# Patient Record
Sex: Female | Born: 1965 | Race: Black or African American | Hispanic: No | Marital: Married | Smoking: Current every day smoker
Health system: Southern US, Community
[De-identification: ages and names within clinical notes are randomized; demographics above are authoritative.]

## PROBLEM LIST (undated history)

## (undated) DIAGNOSIS — E785 Hyperlipidemia, unspecified: Secondary | ICD-10-CM

## (undated) DIAGNOSIS — Z789 Other specified health status: Secondary | ICD-10-CM

## (undated) HISTORY — PX: HAMMER TOE SURGERY: SHX385

## (undated) HISTORY — PX: BREAST SURGERY: SHX581

---

## 1998-05-14 ENCOUNTER — Other Ambulatory Visit: Admission: RE | Admit: 1998-05-14 | Discharge: 1998-05-14 | Payer: Self-pay | Admitting: Obstetrics and Gynecology

## 1998-12-31 ENCOUNTER — Ambulatory Visit (HOSPITAL_COMMUNITY): Admission: RE | Admit: 1998-12-31 | Discharge: 1998-12-31 | Payer: Self-pay | Admitting: Family Medicine

## 1998-12-31 ENCOUNTER — Encounter: Payer: Self-pay | Admitting: Family Medicine

## 1999-05-18 ENCOUNTER — Other Ambulatory Visit: Admission: RE | Admit: 1999-05-18 | Discharge: 1999-05-18 | Payer: Self-pay | Admitting: *Deleted

## 1999-06-07 ENCOUNTER — Ambulatory Visit (HOSPITAL_COMMUNITY): Admission: RE | Admit: 1999-06-07 | Discharge: 1999-06-07 | Payer: Self-pay | Admitting: Obstetrics

## 1999-06-07 ENCOUNTER — Encounter: Payer: Self-pay | Admitting: Obstetrics

## 1999-08-30 ENCOUNTER — Inpatient Hospital Stay (HOSPITAL_COMMUNITY): Admission: AD | Admit: 1999-08-30 | Discharge: 1999-08-30 | Payer: Self-pay | Admitting: Obstetrics

## 1999-10-16 ENCOUNTER — Inpatient Hospital Stay (HOSPITAL_COMMUNITY): Admission: AD | Admit: 1999-10-16 | Discharge: 1999-10-16 | Payer: Self-pay | Admitting: Obstetrics

## 1999-11-17 ENCOUNTER — Inpatient Hospital Stay (HOSPITAL_COMMUNITY): Admission: AD | Admit: 1999-11-17 | Discharge: 1999-11-17 | Payer: Self-pay | Admitting: Obstetrics

## 1999-11-29 ENCOUNTER — Inpatient Hospital Stay (HOSPITAL_COMMUNITY): Admission: AD | Admit: 1999-11-29 | Discharge: 1999-11-29 | Payer: Self-pay | Admitting: Obstetrics

## 1999-12-31 ENCOUNTER — Inpatient Hospital Stay (HOSPITAL_COMMUNITY): Admission: AD | Admit: 1999-12-31 | Discharge: 2000-01-03 | Payer: Self-pay | Admitting: Obstetrics

## 1999-12-31 ENCOUNTER — Encounter (INDEPENDENT_AMBULATORY_CARE_PROVIDER_SITE_OTHER): Payer: Self-pay

## 2000-06-12 ENCOUNTER — Encounter (INDEPENDENT_AMBULATORY_CARE_PROVIDER_SITE_OTHER): Payer: Self-pay | Admitting: *Deleted

## 2000-06-12 ENCOUNTER — Ambulatory Visit (HOSPITAL_BASED_OUTPATIENT_CLINIC_OR_DEPARTMENT_OTHER): Admission: RE | Admit: 2000-06-12 | Discharge: 2000-06-13 | Payer: Self-pay | Admitting: Specialist

## 2001-04-10 ENCOUNTER — Other Ambulatory Visit: Admission: RE | Admit: 2001-04-10 | Discharge: 2001-04-10 | Payer: Self-pay | Admitting: Obstetrics and Gynecology

## 2001-08-06 ENCOUNTER — Emergency Department (HOSPITAL_COMMUNITY): Admission: EM | Admit: 2001-08-06 | Discharge: 2001-08-06 | Payer: Self-pay | Admitting: *Deleted

## 2001-09-10 ENCOUNTER — Encounter: Admission: RE | Admit: 2001-09-10 | Discharge: 2001-09-10 | Payer: Self-pay | Admitting: Urology

## 2001-09-10 ENCOUNTER — Encounter: Payer: Self-pay | Admitting: Urology

## 2002-06-03 ENCOUNTER — Other Ambulatory Visit: Admission: RE | Admit: 2002-06-03 | Discharge: 2002-06-03 | Payer: Self-pay | Admitting: Family Medicine

## 2004-04-06 ENCOUNTER — Emergency Department (HOSPITAL_COMMUNITY): Admission: EM | Admit: 2004-04-06 | Discharge: 2004-04-06 | Payer: Self-pay | Admitting: Family Medicine

## 2004-04-07 ENCOUNTER — Ambulatory Visit (HOSPITAL_COMMUNITY): Admission: RE | Admit: 2004-04-07 | Discharge: 2004-04-07 | Payer: Self-pay | Admitting: Family Medicine

## 2008-12-18 ENCOUNTER — Encounter: Payer: Self-pay | Admitting: Internal Medicine

## 2008-12-18 LAB — CONVERTED CEMR LAB
ALT: 17 units/L
Albumin: 4.4 g/dL
BUN: 12 mg/dL
Basophils Relative: 0 %
CO2: 26 meq/L
Chloride: 101 meq/L
Hemoglobin: 13.7 g/dL
LDL Cholesterol: 173 mg/dL
Lymphocytes, automated: 46 %
Monocytes Relative: 9 %
Platelets: 309 10*3/uL
Potassium: 3.9 meq/L
Total Protein: 7.8 g/dL
WBC: 4.8 10*3/uL

## 2009-02-04 ENCOUNTER — Encounter: Admission: RE | Admit: 2009-02-04 | Discharge: 2009-02-04 | Payer: Self-pay | Admitting: Family Medicine

## 2009-02-22 ENCOUNTER — Emergency Department (HOSPITAL_COMMUNITY): Admission: EM | Admit: 2009-02-22 | Discharge: 2009-02-22 | Payer: Self-pay | Admitting: Emergency Medicine

## 2009-03-09 ENCOUNTER — Emergency Department (HOSPITAL_COMMUNITY): Admission: EM | Admit: 2009-03-09 | Discharge: 2009-03-09 | Payer: Self-pay | Admitting: Family Medicine

## 2009-03-09 DIAGNOSIS — H9209 Otalgia, unspecified ear: Secondary | ICD-10-CM | POA: Insufficient documentation

## 2009-03-09 DIAGNOSIS — M25529 Pain in unspecified elbow: Secondary | ICD-10-CM | POA: Insufficient documentation

## 2009-03-26 ENCOUNTER — Encounter: Payer: Self-pay | Admitting: Internal Medicine

## 2009-03-26 LAB — CONVERTED CEMR LAB
AST: 20 units/L
CO2: 23 meq/L
Chloride: 102 meq/L
Cholesterol: 236 mg/dL
HDL: 50 mg/dL
Total Bilirubin: 0.3 mg/dL
Triglyceride fasting, serum: 148 mg/dL

## 2009-06-19 ENCOUNTER — Encounter (INDEPENDENT_AMBULATORY_CARE_PROVIDER_SITE_OTHER): Payer: Self-pay | Admitting: *Deleted

## 2009-06-22 ENCOUNTER — Encounter: Admission: RE | Admit: 2009-06-22 | Discharge: 2009-06-22 | Payer: Self-pay | Admitting: Family Medicine

## 2009-06-24 ENCOUNTER — Encounter (INDEPENDENT_AMBULATORY_CARE_PROVIDER_SITE_OTHER): Payer: Self-pay | Admitting: *Deleted

## 2009-06-24 LAB — CONVERTED CEMR LAB
Albumin: 4.1 g/dL
CO2: 23 meq/L
Chloride: 101 meq/L
Cholesterol: 235 mg/dL
Creatinine, Ser: 0.67 mg/dL
HDL: 55 mg/dL
MCV: 93 fL
Neutrophils Relative %: 40 %
Platelets: 286 10*3/uL
Potassium: 4.1 meq/L
RBC: 4.38 M/uL
RDW: 14 %
Sodium: 136 meq/L
Triglyceride fasting, serum: 138 mg/dL

## 2010-05-26 ENCOUNTER — Encounter (INDEPENDENT_AMBULATORY_CARE_PROVIDER_SITE_OTHER): Payer: Self-pay | Admitting: *Deleted

## 2010-05-26 LAB — CONVERTED CEMR LAB
AST: 20 units/L
Calcium: 9.3 mg/dL
Cholesterol: 195 mg/dL
Glucose, Bld: 103 mg/dL
LDL Cholesterol: 128 mg/dL
Potassium: 3.8 meq/L
Sodium: 139 meq/L
TSH: 2.71 microintl units/mL
Total Protein: 7.3 g/dL

## 2010-08-27 ENCOUNTER — Emergency Department (HOSPITAL_COMMUNITY): Admission: EM | Admit: 2010-08-27 | Discharge: 2010-08-27 | Payer: Self-pay | Admitting: Emergency Medicine

## 2011-01-05 ENCOUNTER — Encounter (INDEPENDENT_AMBULATORY_CARE_PROVIDER_SITE_OTHER): Payer: Self-pay | Admitting: *Deleted

## 2011-01-05 DIAGNOSIS — E785 Hyperlipidemia, unspecified: Secondary | ICD-10-CM | POA: Insufficient documentation

## 2011-01-07 ENCOUNTER — Ambulatory Visit: Payer: Self-pay | Admitting: Internal Medicine

## 2011-01-07 ENCOUNTER — Encounter: Payer: Self-pay | Admitting: Internal Medicine

## 2011-01-24 ENCOUNTER — Ambulatory Visit: Payer: Self-pay | Admitting: Internal Medicine

## 2011-04-15 NOTE — Op Note (Signed)
[place]  Patient:    [name], [name]                       MRN: [id number] Proc. Date: 06/12/00 Adm. Date:  [id number] Attending:  Gustavus Messing CC:         Yaakov Guthrie. Shon Hough, M.D. (2 copies)                           Operative Report  INDICATIONS:  This is a 45 year old lady with severe macromastia, increased back and shoulder pain, also a resistance to conservative treatment including analgesics.  OPERATION PLANNED:  Bilateral breast reductions using the inferior pedicle technique.  SURGEON:  Yaakov Guthrie. Shon Hough, M.D.  ASSESSMENT:  Margaretha Sheffield, R.N.  DESCRIPTION OF PROCEDURE:  The patient was taken to the operating room and placed on the operating room table in the supine position and was given adequate general anesthesia after she had been setup and drawn for the new measurements about 20 cm from the suprasternal notch and the inferior pedicle reduction mammoplasty.  Preop was done to the chest and breast areas in the routine fashion after she was intubated, and walled off with sterile towels and draped so as to make a sterile field.  Xylocaine 0.25% with epinephrine at 1:400,000 concentration was injected locally 100 cc per thigh.  Next, the areas were scored with a #15 blade and then the skin over the inferior pedicle was de-epithelialized with a #20 blade.  Next, the medial and fatty dermal pedicles were excised down to underlying fascia, and also a new keyhole area was also debulked out laterally and more tissue was taken to improve symmetry.  After proper hemostasis the flaps were transposed and staid with 3-0 Prolene.  Subcutaneous closure was done with 3-0 Monocryl x 2 layers and a running subcuticular stitch of 3-0 Monocryl and 5-0 Monocryl throughout the inverted T.  The wounds were draining with #10 Blake drains which were placed in the depths of wound and brought out through the lateral most portion of the incision  and secured with 3-0 Prolene.  The wounds were cleansed, 0.5 inch Steri-Strips and soft dressings were applied and Xeroform, 4 x 4s, ABDs, Hypafix tape.  She was then taken to recovery in excellent condition.  Estimated blood loss less than 150 cc.  Complications, none.  At the end of the procedure the nipple, areola complexes show good blood supply. DD:  06/12/00 TD:  06/13/00 Job: [id number]

## 2011-04-15 NOTE — H&P (Signed)
[facility]  PatientTOMEKA KANTNER                        MRN: [id number] Adm. Date:  [id number] Attending:  Venita Sheffield                         History and Physical  HISTORY OF PRESENT ILLNESS:   Patient is a 45 year old gravida 2, para 1-0-0-1, who underwent a previous cesarean section.  Her EDC was January 06, 2000.  She was admitted with ruptured membranes which occurred on December 29, 1999; however, patient did not come to the hospital until today.  Cervix was 2 cm, 70%, vertex -3. Fluid color was unknown.  She had a reactive tracing.  Now, at 11:30, IUPC and scalp lead were applied.  She was noted to be having variable decelerations with each contraction and by this time, she had been on Pitocin.  Pitocin was discontinued and amnioinfusion was begun and the plan was to restart the Pitocin after the loading dose of the amnioinfusion; however, by 11:50, with each contraction, she was having prolonged variable decelerations, her cervix was unchanged and it was decided she would be delivered by repeat low transverse cesarean section because of non-reassuring fetal heart.  PHYSICAL EXAMINATION:  LUNGS:                        Clear.  HEART:                        Regular rhythm.  No murmurs.  No gallops.  ABDOMEN:                      Term-size uterus.  Fetal heart 140.  PELVIC:                       As described above.  EXTREMITIES:                  Negative. DD:  12/31/99 TD:  12/31/99 Job: [id number]

## 2011-04-15 NOTE — Discharge Summary (Signed)
[facility]  PatientADELAYDE MINNEY                        MRN: [id number] Adm. Date:  [id number] Disc. Date: 01/03/00 Attending:  Venita Sheffield                           Discharge Summary  HISTORY OF PRESENT ILLNESS:   The patient is a 45 year old gravida 2 para 1-0-0-1, previous C section, [facility] January 06, 2000.  She was admitted with ruptured membranes, which occurred on January 31.  Cervix 2 cm, 70%, vertex, -3.  There as a question of meconium-stained fluid.  Patient underwent a repeat low transverse cesarean section because of nonreassuring fetal heart rate tracing.  She had a 7 pound 7 ounce female, Apgars 8 and 9.  Postoperatively, she did well.  Her hemoglobin on admission was 13.2, discharge 9.4.  DISPOSITION:                  She was discharged home on postoperative day #3, ambulatory.  DIET:                         Regular.  DISCHARGE MEDICATIONS:        Tylenol No. 3 and ferrous sulfate.  FOLLOW-UP:                    To see me in six weeks.  DISCHARGE DIAGNOSES:          Status post repeat low transverse cesarean section at term for nonreassuring fetal heart rate tracing. DD:  01/03/00 TD:  01/03/00 Job: [id number]

## 2011-04-15 NOTE — Op Note (Signed)
[facility]  PatientMERIDEE Nash                        MRN: [id number] Proc. Date: 12/31/99 Adm. Date:  [id number] Attending:  Venita Sheffield                           Operative Report  PREOPERATIVE DIAGNOSES:       Non-reassuring fetal heart rate tracing; previous  cesarean section.  POSTOPERATIVE DIAGNOSES:      Non-reassuring fetal heart rate tracing; previous  cesarean section.  SURGEON:                      Kathreen Cosier, M.D.  ANESTHESIA:                   Epidural.  DESCRIPTION OF PROCEDURE:     Patient was placed on the operating table in a supine position, abdomen prepped and draped and bladder emptied with a Foley catheter. A transverse suprapubic incision was made through an old scar and carried down to the rectus fascia, the fascia cleaned and incised the length of the incision, rectus muscle retracted laterally and peritoneum incised longitudinally.  It was noted on entering the abdomen that there was a large amount of fluid behind the visceroperitoneum in the region of the old uterine scar.  The visceroperitoneum was incised and this fluid was noted to be clear.  By history, the patient had an infection and was hospitalized for two weeks after her previous cesarean section, so this appeared to be the remnants of old hematoma.  A transverse lower uterine incision was made and patient delivered from the OP position of a female, Apgar 8/9; the team was in attendance.  The baby weighed 7 pounds 7 ounces.  The placenta as posterior and removed manually.  Uterine cavity was cleaned with dry laps. Uterine incision was closed with interlocking suture of #1 chromic, including myometrium and endometrium.  The amniotic fluid was clear.  Hemostasis was satisfactory. he bladder flap was reattached with 2-0 chromic.  Uterus was well-contracted. Tubes and ovaries were normal.  Abdomen was closed in layers:  The  peritoneum with a continuous suture of 0 chromic, fascia with continuous suture of 0 Dexon and the skin closed with a subcuticular stitch of 3-0 plain.  Blood loss was 500 cc. Patient tolerated her procedure well and taken to recovery room in good condition. DD:  12/31/99 TD:  01/02/00 Job: [id number]

## 2011-04-20 ENCOUNTER — Other Ambulatory Visit (HOSPITAL_COMMUNITY)
Admission: RE | Admit: 2011-04-20 | Discharge: 2011-04-20 | Disposition: A | Discharge status: Home / Self Care | Payer: Commercial Indemnity | Source: Ambulatory Visit | Attending: Family Medicine | Admitting: Family Medicine

## 2011-04-20 DIAGNOSIS — Z1159 Encounter for screening for other viral diseases: Secondary | ICD-10-CM | POA: Insufficient documentation

## 2011-04-20 DIAGNOSIS — Z01419 Encounter for gynecological examination (general) (routine) without abnormal findings: Secondary | ICD-10-CM | POA: Insufficient documentation

## 2011-04-28 ENCOUNTER — Ambulatory Visit: Payer: Self-pay | Admitting: Internal Medicine

## 2011-08-15 ENCOUNTER — Emergency Department (HOSPITAL_COMMUNITY)
Admission: EM | Admit: 2011-08-15 | Discharge: 2011-08-15 | Disposition: A | Discharge status: Home / Self Care | Payer: Managed Care, Other (non HMO) | Attending: Emergency Medicine | Admitting: Emergency Medicine

## 2011-08-15 ENCOUNTER — Emergency Department (HOSPITAL_COMMUNITY): Payer: Managed Care, Other (non HMO)

## 2011-08-15 DIAGNOSIS — E785 Hyperlipidemia, unspecified: Secondary | ICD-10-CM | POA: Insufficient documentation

## 2011-08-15 DIAGNOSIS — R079 Chest pain, unspecified: Secondary | ICD-10-CM | POA: Insufficient documentation

## 2011-08-15 DIAGNOSIS — R05 Cough: Secondary | ICD-10-CM | POA: Insufficient documentation

## 2011-08-15 DIAGNOSIS — R059 Cough, unspecified: Secondary | ICD-10-CM | POA: Insufficient documentation

## 2011-08-15 DIAGNOSIS — J40 Bronchitis, not specified as acute or chronic: Secondary | ICD-10-CM | POA: Insufficient documentation

## 2011-08-15 DIAGNOSIS — R1310 Dysphagia, unspecified: Secondary | ICD-10-CM | POA: Insufficient documentation

## 2012-01-31 ENCOUNTER — Encounter (HOSPITAL_COMMUNITY): Payer: Self-pay | Admitting: Pharmacist

## 2012-02-02 ENCOUNTER — Encounter (HOSPITAL_COMMUNITY): Payer: Self-pay

## 2012-02-02 ENCOUNTER — Encounter (HOSPITAL_COMMUNITY)
Admission: RE | Admit: 2012-02-02 | Discharge: 2012-02-02 | Disposition: A | Discharge status: Home / Self Care | Payer: Commercial Managed Care - PPO | Source: Ambulatory Visit | Attending: Obstetrics and Gynecology | Admitting: Obstetrics and Gynecology

## 2012-02-02 HISTORY — DX: Hyperlipidemia, unspecified: E78.5

## 2012-02-02 HISTORY — DX: Other specified health status: Z78.9

## 2012-02-02 LAB — CBC
MCH: 24.8 pg — ABNORMAL LOW (ref 26.0–34.0)
MCHC: 31.6 g/dL (ref 30.0–36.0)
Platelets: 385 10*3/uL (ref 150–400)
RDW: 16.7 % — ABNORMAL HIGH (ref 11.5–15.5)

## 2012-02-02 LAB — SURGICAL PCR SCREEN: MRSA, PCR: NEGATIVE

## 2012-02-02 NOTE — Patient Instructions (Addendum)
YOUR PROCEDURE IS SCHEDULED ON:02/09/12  ENTER THROUGH THE MAIN ENTRANCE OF [facility] AT:8am  USE DESK PHONE AND DIAL [phone] TO INFORM us OF YOUR ARRIVAL  CALL [phone] IF YOU HAVE ANY QUESTIONS OR PROBLEMS PRIOR TO YOUR ARRIVAL.  REMEMBER: DO NOT EAT OR DRINK AFTER MIDNIGHT :Wed  SPECIAL INSTRUCTIONS:   YOU MAY BRUSH YOUR TEETH THE MORNING OF SURGERY   TAKE THESE MEDICINES THE DAY OF SURGERY WITH SIP OF WATER:none   DO NOT WEAR JEWELRY, EYE MAKEUP, LIPSTICK OR DARK FINGERNAIL POLISH DO NOT WEAR LOTIONS  DO NOT SHAVE FOR 48 HOURS PRIOR TO SURGERY  YOU WILL NOT BE ALLOWED TO DRIVE YOURSELF HOME.  NAME OF DRIVERHarvie Heck- [phone]

## 2012-02-03 ENCOUNTER — Other Ambulatory Visit: Payer: Self-pay | Admitting: Obstetrics and Gynecology

## 2012-02-03 NOTE — OR Nursing (Signed)
Procedure changed by office per shannell posted as req

## 2012-02-08 MED ORDER — CLINDAMYCIN PHOSPHATE 900 MG/50ML IV SOLN
900.0000 mg | INTRAVENOUS | Status: DC
  Filled 2012-02-08: qty 50

## 2012-02-08 MED ORDER — CIPROFLOXACIN IN D5W 400 MG/200ML IV SOLN
400.0000 mg | INTRAVENOUS | Status: DC
  Filled 2012-02-08: qty 200

## 2012-02-09 ENCOUNTER — Encounter (HOSPITAL_COMMUNITY): Payer: Self-pay | Admitting: Anesthesiology

## 2012-02-09 ENCOUNTER — Encounter (HOSPITAL_COMMUNITY): Payer: Self-pay | Admitting: *Deleted

## 2012-02-09 ENCOUNTER — Ambulatory Visit (HOSPITAL_COMMUNITY)
Admission: RE | Admit: 2012-02-09 | Discharge: 2012-02-10 | Disposition: A | Discharge status: Home / Self Care | Payer: Commercial Managed Care - PPO | Source: Ambulatory Visit | Attending: Obstetrics and Gynecology | Admitting: Obstetrics and Gynecology

## 2012-02-09 ENCOUNTER — Ambulatory Visit (HOSPITAL_COMMUNITY): Payer: Commercial Managed Care - PPO | Admitting: Anesthesiology

## 2012-02-09 ENCOUNTER — Encounter (HOSPITAL_COMMUNITY)
Admission: RE | Disposition: A | Discharge status: Home / Self Care | Payer: Self-pay | Source: Ambulatory Visit | Attending: Obstetrics and Gynecology

## 2012-02-09 DIAGNOSIS — Z01818 Encounter for other preprocedural examination: Secondary | ICD-10-CM | POA: Insufficient documentation

## 2012-02-09 DIAGNOSIS — N946 Dysmenorrhea, unspecified: Secondary | ICD-10-CM | POA: Insufficient documentation

## 2012-02-09 DIAGNOSIS — Z90710 Acquired absence of both cervix and uterus: Secondary | ICD-10-CM

## 2012-02-09 DIAGNOSIS — N920 Excessive and frequent menstruation with regular cycle: Secondary | ICD-10-CM | POA: Insufficient documentation

## 2012-02-09 DIAGNOSIS — Z01812 Encounter for preprocedural laboratory examination: Secondary | ICD-10-CM | POA: Insufficient documentation

## 2012-02-09 DIAGNOSIS — D259 Leiomyoma of uterus, unspecified: Secondary | ICD-10-CM | POA: Insufficient documentation

## 2012-02-09 HISTORY — PX: LAPAROSCOPIC ASSISTED VAGINAL HYSTERECTOMY: SHX5398

## 2012-02-09 LAB — BASIC METABOLIC PANEL
CO2: 24 mEq/L (ref 19–32)
Calcium: 9.2 mg/dL (ref 8.4–10.5)
Creatinine, Ser: 0.68 mg/dL (ref 0.50–1.10)
GFR calc non Af Amer: >90 mL/min (ref >90)

## 2012-02-09 LAB — PREGNANCY, URINE: Preg Test, Ur: NEGATIVE

## 2012-02-09 SURGERY — HYSTERECTOMY, VAGINAL, LAPAROSCOPY-ASSISTED
Anesthesia: General | Site: Abdomen | Wound class: Clean Contaminated

## 2012-02-09 MED ORDER — DIPHENHYDRAMINE HCL 50 MG/ML IJ SOLN: 12.5000 mg | Freq: Four times a day (QID) | INTRAMUSCULAR | Status: DC | PRN

## 2012-02-09 MED ORDER — PROPOFOL 10 MG/ML IV EMUL
INTRAVENOUS | Status: AC
  Filled 2012-02-09: qty 20

## 2012-02-09 MED ORDER — DEXTROSE IN LACTATED RINGERS 5 % IV SOLN
INTRAVENOUS | Status: DC
  Administered 2012-02-09 – 2012-02-10 (×2): via INTRAVENOUS

## 2012-02-09 MED ORDER — MIDAZOLAM HCL 2 MG/2ML IJ SOLN
INTRAMUSCULAR | Status: AC
  Filled 2012-02-09: qty 2

## 2012-02-09 MED ORDER — HYDROMORPHONE HCL PF 1 MG/ML IJ SOLN
INTRAMUSCULAR | Status: DC | PRN
  Administered 2012-02-09 (×2): 1 mg via INTRAVENOUS

## 2012-02-09 MED ORDER — LACTATED RINGERS IV SOLN: INTRAVENOUS | Status: DC

## 2012-02-09 MED ORDER — HYDROMORPHONE HCL PF 1 MG/ML IJ SOLN
INTRAMUSCULAR | Status: AC
  Filled 2012-02-09: qty 1

## 2012-02-09 MED ORDER — ROCURONIUM BROMIDE 100 MG/10ML IV SOLN
INTRAVENOUS | Status: DC | PRN
  Administered 2012-02-09: 50 mg via INTRAVENOUS

## 2012-02-09 MED ORDER — CLINDAMYCIN PHOSPHATE 900 MG/50ML IV SOLN
900.0000 mg | Freq: Three times a day (TID) | INTRAVENOUS | Status: AC
  Administered 2012-02-09 – 2012-02-10 (×2): 900 mg via INTRAVENOUS
  Filled 2012-02-09 (×2): qty 50

## 2012-02-09 MED ORDER — HYDROMORPHONE HCL PF 1 MG/ML IJ SOLN: 0.2500 mg | INTRAMUSCULAR | Status: DC | PRN

## 2012-02-09 MED ORDER — ONDANSETRON HCL 4 MG/2ML IJ SOLN: 4.0000 mg | Freq: Four times a day (QID) | INTRAMUSCULAR | Status: DC | PRN

## 2012-02-09 MED ORDER — LIDOCAINE HCL (CARDIAC) 20 MG/ML IV SOLN
INTRAVENOUS | Status: DC | PRN
  Administered 2012-02-09: 80 mg via INTRAVENOUS

## 2012-02-09 MED ORDER — HYDROMORPHONE 0.3 MG/ML IV SOLN
INTRAVENOUS | Status: DC
  Administered 2012-02-09: 1.59 mg via INTRAVENOUS
  Administered 2012-02-09: 18:00:00 via INTRAVENOUS
  Administered 2012-02-10: 1.59 mg via INTRAVENOUS
  Administered 2012-02-10: 1.39 mg via INTRAVENOUS

## 2012-02-09 MED ORDER — FENTANYL CITRATE 0.05 MG/ML IJ SOLN
INTRAMUSCULAR | Status: DC | PRN
  Administered 2012-02-09: 150 ug via INTRAVENOUS
  Administered 2012-02-09 (×3): 50 ug via INTRAVENOUS

## 2012-02-09 MED ORDER — ZOLPIDEM TARTRATE 5 MG PO TABS: 5.0000 mg | ORAL_TABLET | Freq: Every evening | ORAL | Status: DC | PRN

## 2012-02-09 MED ORDER — SODIUM CHLORIDE 0.9 % IJ SOLN
INTRAMUSCULAR | Status: DC | PRN
  Administered 2012-02-09: 30 mL via INTRAVENOUS

## 2012-02-09 MED ORDER — VASOPRESSIN 20 UNIT/ML IJ SOLN
INTRAMUSCULAR | Status: AC
  Filled 2012-02-09: qty 1

## 2012-02-09 MED ORDER — ONDANSETRON HCL 4 MG PO TABS: 4.0000 mg | ORAL_TABLET | Freq: Four times a day (QID) | ORAL | Status: DC | PRN

## 2012-02-09 MED ORDER — FENTANYL CITRATE 0.05 MG/ML IJ SOLN
INTRAMUSCULAR | Status: AC
  Filled 2012-02-09: qty 5

## 2012-02-09 MED ORDER — ONDANSETRON HCL 4 MG/2ML IJ SOLN
INTRAMUSCULAR | Status: AC
  Filled 2012-02-09: qty 2

## 2012-02-09 MED ORDER — INDIGOTINDISULFONATE SODIUM 8 MG/ML IJ SOLN
INTRAMUSCULAR | Status: AC
  Filled 2012-02-09: qty 5

## 2012-02-09 MED ORDER — CIPROFLOXACIN IN D5W 400 MG/200ML IV SOLN
INTRAVENOUS | Status: DC | PRN
  Administered 2012-02-09: 400 mg via INTRAVENOUS

## 2012-02-09 MED ORDER — NEOSTIGMINE METHYLSULFATE 1 MG/ML IJ SOLN
INTRAMUSCULAR | Status: DC | PRN
  Administered 2012-02-09: 3 mg via INTRAVENOUS

## 2012-02-09 MED ORDER — BUPIVACAINE HCL (PF) 0.25 % IJ SOLN
INTRAMUSCULAR | Status: DC | PRN
  Administered 2012-02-09: 4 mL

## 2012-02-09 MED ORDER — PROPOFOL 10 MG/ML IV EMUL
INTRAVENOUS | Status: DC | PRN
  Administered 2012-02-09: 30 mg via INTRAVENOUS
  Administered 2012-02-09: 200 mg via INTRAVENOUS

## 2012-02-09 MED ORDER — CLINDAMYCIN PHOSPHATE 600 MG/50ML IV SOLN
INTRAVENOUS | Status: DC | PRN
  Administered 2012-02-09: 900 mg via INTRAVENOUS

## 2012-02-09 MED ORDER — OXYCODONE-ACETAMINOPHEN 5-325 MG PO TABS
1.0000 | ORAL_TABLET | ORAL | Status: DC | PRN
  Administered 2012-02-10: 2 via ORAL
  Filled 2012-02-09: qty 2

## 2012-02-09 MED ORDER — VASOPRESSIN 20 UNIT/ML IJ SOLN
INTRAMUSCULAR | Status: DC | PRN
  Administered 2012-02-09: 20 [IU] via INTRAOSSEOUS

## 2012-02-09 MED ORDER — GLYCOPYRROLATE 0.2 MG/ML IJ SOLN
INTRAMUSCULAR | Status: DC | PRN
  Administered 2012-02-09: 0.4 mg via INTRAVENOUS
  Administered 2012-02-09: 0.1 mg via INTRAVENOUS

## 2012-02-09 MED ORDER — DIPHENHYDRAMINE HCL 12.5 MG/5ML PO ELIX
12.5000 mg | ORAL_SOLUTION | Freq: Four times a day (QID) | ORAL | Status: DC | PRN
  Filled 2012-02-09: qty 5

## 2012-02-09 MED ORDER — ROCURONIUM BROMIDE 50 MG/5ML IV SOLN
INTRAVENOUS | Status: AC
  Filled 2012-02-09: qty 1

## 2012-02-09 MED ORDER — DEXAMETHASONE SODIUM PHOSPHATE 10 MG/ML IJ SOLN
INTRAMUSCULAR | Status: AC
  Filled 2012-02-09: qty 1

## 2012-02-09 MED ORDER — EPHEDRINE 5 MG/ML INJ
INTRAVENOUS | Status: AC
  Filled 2012-02-09: qty 10

## 2012-02-09 MED ORDER — ONDANSETRON HCL 4 MG/2ML IJ SOLN
4.0000 mg | Freq: Four times a day (QID) | INTRAMUSCULAR | Status: DC | PRN
  Administered 2012-02-09: 4 mg via INTRAVENOUS
  Filled 2012-02-09: qty 2

## 2012-02-09 MED ORDER — FENTANYL CITRATE 0.05 MG/ML IJ SOLN
INTRAMUSCULAR | Status: AC
  Filled 2012-02-09: qty 2

## 2012-02-09 MED ORDER — HYDROMORPHONE 0.3 MG/ML IV SOLN
INTRAVENOUS | Status: AC
  Filled 2012-02-09: qty 25

## 2012-02-09 MED ORDER — PANTOPRAZOLE SODIUM 40 MG PO TBEC
40.0000 mg | DELAYED_RELEASE_TABLET | Freq: Every day | ORAL | Status: DC
  Administered 2012-02-09 – 2012-02-10 (×2): 40 mg via ORAL
  Filled 2012-02-09 (×3): qty 1

## 2012-02-09 MED ORDER — CIPROFLOXACIN IN D5W 400 MG/200ML IV SOLN
400.0000 mg | Freq: Two times a day (BID) | INTRAVENOUS | Status: AC
  Administered 2012-02-09 – 2012-02-10 (×2): 400 mg via INTRAVENOUS
  Filled 2012-02-09 (×2): qty 200

## 2012-02-09 MED ORDER — LACTATED RINGERS IV SOLN
INTRAVENOUS | Status: DC
  Administered 2012-02-09 (×4): via INTRAVENOUS

## 2012-02-09 MED ORDER — MIDAZOLAM HCL 5 MG/5ML IJ SOLN
INTRAMUSCULAR | Status: DC | PRN
  Administered 2012-02-09: 2 mg via INTRAVENOUS

## 2012-02-09 MED ORDER — SODIUM CHLORIDE 0.9 % IJ SOLN: 9.0000 mL | INTRAMUSCULAR | Status: DC | PRN

## 2012-02-09 MED ORDER — BUPIVACAINE HCL (PF) 0.25 % IJ SOLN
INTRAMUSCULAR | Status: AC
  Filled 2012-02-09: qty 30

## 2012-02-09 MED ORDER — ONDANSETRON HCL 4 MG/2ML IJ SOLN
INTRAMUSCULAR | Status: DC | PRN
  Administered 2012-02-09: 4 mg via INTRAVENOUS

## 2012-02-09 MED ORDER — ONDANSETRON HCL 4 MG/2ML IJ SOLN: 4.0000 mg | Freq: Once | INTRAMUSCULAR | Status: DC | PRN

## 2012-02-09 MED ORDER — GLYCOPYRROLATE 0.2 MG/ML IJ SOLN
INTRAMUSCULAR | Status: AC
  Filled 2012-02-09: qty 2

## 2012-02-09 MED ORDER — ESTRADIOL 0.1 MG/GM VA CREA
TOPICAL_CREAM | VAGINAL | Status: AC
  Filled 2012-02-09: qty 42.5

## 2012-02-09 MED ORDER — LIDOCAINE HCL (CARDIAC) 20 MG/ML IV SOLN
INTRAVENOUS | Status: AC
  Filled 2012-02-09: qty 5

## 2012-02-09 MED ORDER — MORPHINE SULFATE 2 MG/ML IJ SOLN: 0.0500 mg/kg | INTRAMUSCULAR | Status: DC | PRN

## 2012-02-09 MED ORDER — LACTATED RINGERS IR SOLN
Status: DC | PRN
  Administered 2012-02-09: 3000 mL

## 2012-02-09 MED ORDER — DEXAMETHASONE SODIUM PHOSPHATE 10 MG/ML IJ SOLN
INTRAMUSCULAR | Status: DC | PRN
  Administered 2012-02-09: 10 mg via INTRAVENOUS

## 2012-02-09 MED ORDER — EPHEDRINE SULFATE 50 MG/ML IJ SOLN
INTRAMUSCULAR | Status: DC | PRN
  Administered 2012-02-09: 10 mg via INTRAVENOUS
  Administered 2012-02-09: 5 mg via INTRAVENOUS
  Administered 2012-02-09: 10 mg via INTRAVENOUS
  Administered 2012-02-09: 5 mg via INTRAVENOUS
  Administered 2012-02-09: 10 mg via INTRAVENOUS

## 2012-02-09 MED ORDER — HYDROMORPHONE HCL PF 1 MG/ML IJ SOLN
0.2000 mg | INTRAMUSCULAR | Status: DC | PRN
Start: 2012-02-09 — End: 2012-02-10

## 2012-02-09 MED ORDER — NALOXONE HCL 0.4 MG/ML IJ SOLN: 0.4000 mg | INTRAMUSCULAR | Status: DC | PRN

## 2012-02-09 MED ORDER — MENTHOL 3 MG MT LOZG: 1.0000 | LOZENGE | OROMUCOSAL | Status: DC | PRN

## 2012-02-09 SURGICAL SUPPLY — 71 items
ADH SKN CLS APL DERMABOND .7 (GAUZE/BANDAGES/DRESSINGS) ×2
APPLICATOR COTTON TIP 6IN STRL (MISCELLANEOUS) ×1 IMPLANT
BAG URINE DRAINAGE (UROLOGICAL SUPPLIES) ×2 IMPLANT
BLADE EXTENDED COATED 6.5IN (ELECTRODE) ×2 IMPLANT
CABLE HIGH FREQUENCY MONO STRZ (ELECTRODE) IMPLANT
CATH FOLEY 3WAY  5CC 16FR (CATHETERS) ×1
CATH FOLEY 3WAY 5CC 16FR (CATHETERS) ×1 IMPLANT
CATH ROBINSON RED A/P 16FR (CATHETERS) IMPLANT
CLOTH BEACON ORANGE TIMEOUT ST (SAFETY) ×3 IMPLANT
CONT PATH 16OZ SNAP LID 3702 (MISCELLANEOUS) IMPLANT
COUNTER NEEDLE 1200 MAGNETIC (NEEDLE) ×2 IMPLANT
COVER MAYO STAND STRL (DRAPES) ×3 IMPLANT
COVER TABLE BACK 60X90 (DRAPES) ×3 IMPLANT
DECANTER SPIKE VIAL GLASS SM (MISCELLANEOUS) IMPLANT
DERMABOND ADVANCED (GAUZE/BANDAGES/DRESSINGS) ×1
DERMABOND ADVANCED .7 DNX12 (GAUZE/BANDAGES/DRESSINGS) ×2 IMPLANT
DRAPE HYSTEROSCOPY (DRAPE) ×3 IMPLANT
DRAPE WARM FLUID 44X44 (DRAPE) ×1 IMPLANT
ELECT LIGASURE LONG (ELECTRODE) IMPLANT
ELECT REM PT RETURN 9FT ADLT (ELECTROSURGICAL)
ELECTRODE REM PT RTRN 9FT ADLT (ELECTROSURGICAL) IMPLANT
EVACUATOR SMOKE 8.L (FILTER) ×4 IMPLANT
FORCEPS CUTTING 33CM 5MM (CUTTING FORCEPS) ×3 IMPLANT
FORCEPS CUTTING 45CM 5MM (CUTTING FORCEPS) ×2 IMPLANT
GLOVE BIO SURGEON STRL SZ 6.5 (GLOVE) ×6 IMPLANT
GLOVE BIOGEL PI IND STRL 6.5 (GLOVE) ×2 IMPLANT
GLOVE BIOGEL PI IND STRL 7.0 (GLOVE) ×4 IMPLANT
GLOVE BIOGEL PI INDICATOR 6.5 (GLOVE) ×1
GLOVE BIOGEL PI INDICATOR 7.0 (GLOVE) ×2
GOWN PREVENTION PLUS LG XLONG (DISPOSABLE) ×12 IMPLANT
HEMOSTAT SURGICEL 2X3 (HEMOSTASIS) ×2 IMPLANT
HEMOSTAT SURGICEL 4X8 (HEMOSTASIS) ×2 IMPLANT
NDL INSUFFLATION 14GA 120MM (NEEDLE) ×1 IMPLANT
NDL INSUFFLATION 14GA 150MM (NEEDLE) IMPLANT
NDL SPNL 22GX1.5 QUINCKE BK (NEEDLE) ×1 IMPLANT
NEEDLE INSUFFLATION 14GA 120MM (NEEDLE) ×3 IMPLANT
NEEDLE INSUFFLATION 14GA 150MM (NEEDLE) ×3 IMPLANT
NEEDLE SPNL 22GX1.5 QUINCKE BK (NEEDLE) ×3 IMPLANT
NS IRRIG 1000ML POUR BTL (IV SOLUTION) ×3 IMPLANT
OCCLUDER COLPOPNEUMO (BALLOONS) ×3 IMPLANT
PACK LAVH (CUSTOM PROCEDURE TRAY) ×3 IMPLANT
PROTECTOR NERVE ULNAR (MISCELLANEOUS) ×3 IMPLANT
SCALPEL HARMONIC ACE (MISCELLANEOUS) ×2 IMPLANT
SCISSORS LAP 5X35 DISP (ENDOMECHANICALS) IMPLANT
SET CYSTO W/LG BORE CLAMP LF (SET/KITS/TRAYS/PACK) ×2 IMPLANT
SET IRRIG TUBING LAPAROSCOPIC (IRRIGATION / IRRIGATOR) ×3 IMPLANT
SOLUTION ELECTROLUBE (MISCELLANEOUS) ×3 IMPLANT
STRIP CLOSURE SKIN 1/4X3 (GAUZE/BANDAGES/DRESSINGS) IMPLANT
SUT PDS AB 1 CT1 36 (SUTURE) IMPLANT
SUT VIC AB 0 CT1 18XCR BRD8 (SUTURE) ×5 IMPLANT
SUT VIC AB 0 CT1 36 (SUTURE) ×10 IMPLANT
SUT VIC AB 0 CT1 8-18 (SUTURE) ×6
SUT VIC AB 3-0 SH 27 (SUTURE)
SUT VIC AB 3-0 SH 27X BRD (SUTURE) IMPLANT
SUT VICRYL 0 TIES 12 18 (SUTURE) ×3 IMPLANT
SUT VICRYL 0 UR6 27IN ABS (SUTURE) ×6 IMPLANT
SUT VICRYL 4-0 PS2 18IN ABS (SUTURE) ×5 IMPLANT
SYR 50ML LL SCALE MARK (SYRINGE) ×3 IMPLANT
TIP UTERINE 5.1X6CM LAV DISP (MISCELLANEOUS) IMPLANT
TIP UTERINE 6.7X10CM GRN DISP (MISCELLANEOUS) IMPLANT
TIP UTERINE 6.7X6CM WHT DISP (MISCELLANEOUS) IMPLANT
TIP UTERINE 6.7X8CM BLUE DISP (MISCELLANEOUS) IMPLANT
TOWEL OR 17X24 6PK STRL BLUE (TOWEL DISPOSABLE) ×6 IMPLANT
TRAY FOLEY CATH 14FR (SET/KITS/TRAYS/PACK) ×1 IMPLANT
TROCAR BALLN 12MMX100 BLUNT (TROCAR) ×2 IMPLANT
TROCAR XCEL NON-BLD 11X100MML (ENDOMECHANICALS) IMPLANT
TROCAR Z-THREAD BLADED 11X100M (TROCAR) ×3 IMPLANT
TROCAR Z-THREAD BLADED 5X100MM (TROCAR) ×6 IMPLANT
TROCAR Z-THREAD FIOS 5X100MM (TROCAR) IMPLANT
WARMER LAPAROSCOPE (MISCELLANEOUS) ×1 IMPLANT
WATER STERILE IRR 1000ML POUR (IV SOLUTION) ×3 IMPLANT

## 2012-02-09 NOTE — Transfer of Care (Signed)
Immediate Anesthesia Transfer of Care Note  Patient: [name]  Procedure(s) Performed: Procedure(s) (LRB): LAPAROSCOPIC ASSISTED VAGINAL HYSTERECTOMY (N/A)  Patient Location: PACU  Anesthesia Type: General  Level of Consciousness: awake, alert  and oriented  Airway & Oxygen Therapy: Patient Spontanous Breathing and Patient connected to nasal cannula oxygen  Post-op Assessment: Report given to PACU RN and Post -op Vital signs reviewed and stable  Post vital signs: Reviewed and stable  Complications: No apparent anesthesia complications

## 2012-02-09 NOTE — Anesthesia Preprocedure Evaluation (Signed)
Anesthesia Evaluation  Patient identified by MRN, date of birth, ID band Patient awake    Airway Mallampati: I TM Distance: >3 FB Neck ROM: Full    Dental  (+) Teeth Intact, Dental Advisory Given and Caps,    Pulmonary  breath sounds clear to auscultation        Cardiovascular Rhythm:Regular Rate:Normal     Neuro/Psych    GI/Hepatic   Endo/Other    Renal/GU      Musculoskeletal   Abdominal   Peds  Hematology   Anesthesia Other Findings   Reproductive/Obstetrics                           Anesthesia Physical Anesthesia Plan  ASA: I  Anesthesia Plan: General   Post-op Pain Management:    Induction: Intravenous  Airway Management Planned: Oral ETT  Additional Equipment:   Intra-op Plan:   Post-operative Plan: Extubation in OR  Informed Consent: I have reviewed the patients History and Physical, chart, labs and discussed the procedure including the risks, benefits and alternatives for the proposed anesthesia with the patient or authorized representative who has indicated his/her understanding and acceptance.   Dental advisory given  Plan Discussed with: CRNA, Surgeon and Anesthesiologist  Anesthesia Plan Comments:         Anesthesia Quick Evaluation

## 2012-02-09 NOTE — Brief Op Note (Signed)
02/09/2012  3:08 PM  PATIENT:  [name]  46 y.o. female  PRE-OPERATIVE DIAGNOSIS:  Previous Cesarean Section x 2, Menorrhagia, Dysmenorrhea  POST-OPERATIVE DIAGNOSIS:  Previous Cesarean Section x 2, Menorrhagia, Dysmenorrhea, uterine fibroids  PROCEDURE:  Procedure(s) (LRB): LAPAROSCOPIC ASSISTED VAGINAL HYSTERECTOMY (N/A)  SURGEON:  Surgeon(s) and Role:    * Severin Bou Cathie Beams, MD - Primary  PHYSICIAN ASSISTANT:   ASSISTANTS: Arlan Organ, CNM   ANESTHESIA:   general  FINDINGS; multiple uterine fibroids, nl tubes and ovaries 285 grams Nl liver edge  EBL:  Total I/O In: 3000 [I.V.:3000] Out: 450 [Urine:250; Blood:200]  BLOOD ADMINISTERED:none  DRAINS: none   LOCAL MEDICATIONS USED:  MARCAINE     SPECIMEN:  Source of Specimen:  uterus with cervix  DISPOSITION OF SPECIMEN:  PATHOLOGY  COUNTS:  YES  TOURNIQUET:  * No tourniquets in log *  DICTATION: .Other Dictation: Dictation Number [id number]  PLAN OF CARE: Admit for overnight observation  PATIENT DISPOSITION:  PACU - hemodynamically stable.   Delay start of Pharmacological VTE agent (>24hrs) due to surgical blood loss or risk of bleeding: no

## 2012-02-09 NOTE — Anesthesia Procedure Notes (Signed)
Procedure Name: Intubation Date/Time: 02/09/2012 12:08 PM Performed by: Graciela Husbands Pre-anesthesia Checklist: Suction available, Emergency Drugs available, Timeout performed, Patient identified and Patient being monitored Patient Re-evaluated:Patient Re-evaluated prior to inductionOxygen Delivery Method: Circle system utilized Preoxygenation: Pre-oxygenation with 100% oxygen Intubation Type: IV induction Ventilation: Mask ventilation without difficulty and Oral airway inserted - appropriate to patient size Laryngoscope Size: Mac and 3 Grade View: Grade II Tube type: Oral Tube size: 7.0 mm Number of attempts: 1 Airway Equipment and Method: Stylet Placement Confirmation: ETT inserted through vocal cords under direct vision,  positive ETCO2 and breath sounds checked- equal and bilateral Secured at: 21 cm Tube secured with: Tape Dental Injury: Teeth and Oropharynx as per pre-operative assessment

## 2012-02-09 NOTE — Anesthesia Postprocedure Evaluation (Signed)
Anesthesia Post Note  Patient: [name]  Procedure(s) Performed: Procedure(s) (LRB): LAPAROSCOPIC ASSISTED VAGINAL HYSTERECTOMY (N/A)  Anesthesia type: General  Patient location: PACU  Post pain: Pain level controlled  Post assessment: Post-op Vital signs reviewed  Last Vitals:  Filed Vitals:   02/09/12 0832  BP: 114/71  Pulse: 70  Temp: 36.5 C  Resp: 16    Post vital signs: Reviewed  Level of consciousness: sedated  Complications: No apparent anesthesia complicationsfj

## 2012-02-10 LAB — BASIC METABOLIC PANEL
BUN: 6 mg/dL (ref 6–23)
Chloride: 101 mEq/L (ref 96–112)
GFR calc non Af Amer: >90 mL/min (ref >90)
Glucose, Bld: 109 mg/dL — ABNORMAL HIGH (ref 70–99)
Potassium: 3.7 mEq/L (ref 3.5–5.1)

## 2012-02-10 LAB — CBC
HCT: 25.6 % — ABNORMAL LOW (ref 36.0–46.0)
Hemoglobin: 8.2 g/dL — ABNORMAL LOW (ref 12.0–15.0)
MCHC: 32 g/dL (ref 30.0–36.0)

## 2012-02-10 MED ORDER — ONDANSETRON HCL 4 MG/2ML IJ SOLN
INTRAMUSCULAR | Status: AC
  Filled 2012-02-10: qty 2

## 2012-02-10 MED ORDER — SIMETHICONE 80 MG PO CHEW
80.0000 mg | CHEWABLE_TABLET | Freq: Four times a day (QID) | ORAL | Status: DC
  Administered 2012-02-10: 80 mg via ORAL

## 2012-02-10 MED ORDER — FENTANYL CITRATE 0.05 MG/ML IJ SOLN
INTRAMUSCULAR | Status: AC
  Filled 2012-02-10: qty 5

## 2012-02-10 MED ORDER — DEXAMETHASONE SODIUM PHOSPHATE 10 MG/ML IJ SOLN
INTRAMUSCULAR | Status: AC
  Filled 2012-02-10: qty 1

## 2012-02-10 MED ORDER — LIDOCAINE HCL (CARDIAC) 20 MG/ML IV SOLN
INTRAVENOUS | Status: AC
  Filled 2012-02-10: qty 5

## 2012-02-10 MED ORDER — MIDAZOLAM HCL 2 MG/2ML IJ SOLN
INTRAMUSCULAR | Status: AC
  Filled 2012-02-10: qty 2

## 2012-02-10 MED ORDER — SUCCINYLCHOLINE CHLORIDE 20 MG/ML IJ SOLN
INTRAMUSCULAR | Status: AC
  Filled 2012-02-10: qty 10

## 2012-02-10 MED ORDER — BISACODYL 10 MG RE SUPP
10.0000 mg | Freq: Once | RECTAL | Status: AC
  Administered 2012-02-10: 10 mg via RECTAL
  Filled 2012-02-10: qty 1

## 2012-02-10 MED ORDER — ROCURONIUM BROMIDE 50 MG/5ML IV SOLN
INTRAVENOUS | Status: AC
Start: 2012-02-10 — End: 2012-02-10
  Filled 2012-02-10: qty 1

## 2012-02-10 MED ORDER — GLYCOPYRROLATE 0.2 MG/ML IJ SOLN
INTRAMUSCULAR | Status: AC
  Filled 2012-02-10: qty 1

## 2012-02-10 MED ORDER — PROPOFOL 10 MG/ML IV EMUL
INTRAVENOUS | Status: AC
  Filled 2012-02-10: qty 20

## 2012-02-10 MED ORDER — NEOSTIGMINE METHYLSULFATE 1 MG/ML IJ SOLN
INTRAMUSCULAR | Status: AC
  Filled 2012-02-10: qty 10

## 2012-02-10 NOTE — Discharge Instructions (Signed)
Call if temperature greater than equal to 100.4, nothing per vagina for 4-6 weeks or severe nausea vomiting, increased incisional pain , drainage or redness in the incision site, no straining with bowel movements, showers no bath °

## 2012-02-10 NOTE — Discharge Summary (Signed)
Physician Discharge Summary  Patient ID: [name] MRN: [id number] DOB/AGE: 1966-08-30 46 y.o.  Admit date: 02/09/2012 Discharge date: 02/10/2012  Admission Diagnoses: menorrhagia, dysmenorrhea, uterine fibroid  Discharge Diagnoses: menorrhagia, dysmenorrhea, uterine fibroids Active Problems:  * No active hospital problems. *    Discharged Condition: stable  Hospital Course: uncomplicated postop course. S/p LAVH  Consults: None  Significant Diagnostic Studies: labs: hgb  Treatments: surgery: LAVH  Discharge Exam: Blood pressure 127/73, pulse 99, temperature 98.2 F (36.8 C), temperature source Oral, resp. rate 18, height 5\' 6"  (1.676 m), weight 165 lb (74.844 kg), SpO2 97.00%. General appearance: alert, cooperative and no distress Resp: clear to auscultation bilaterally Cardio: regular rate and rhythm, S1, S2 normal, no murmur, click, rub or gallop GI: soft, non-tender; bowel sounds normal; no masses,  no organomegaly Extremities: no edema, redness or tenderness in the calves or thighs Incision/Wound:well approximated no erythema/induration or exudate  Disposition: 01-Home or Self Care  Discharge Orders    Future Orders Please Complete By Expires   Diet - low sodium heart healthy      Discharge instructions      Comments:   Call if temperature greater than equal to 100.4, nothing per vagina for 4-6 weeks or severe nausea vomiting, increased incisional pain , drainage or redness in the incision site, no straining with bowel movements, showers no bath   May walk up steps        Medication List  As of 02/10/2012  2:51 PM   TAKE these medications         HYDROcodone-acetaminophen 5-500 MG per tablet   Commonly known as: VICODIN   Take 1 tablet by mouth every 6 (six) hours as needed. For mild pain.      lovastatin 40 MG tablet   Commonly known as: MEVACOR   Take 40 mg by mouth at bedtime. Pt. Thinks this is current dose but maybe 20mg .  Pharmacy didn't have record.             Follow-up Information    Follow up with Tannon Peerson A, MD in 4 weeks.   Contact information:   [address] [place] [address] [phone]          Signed: Serita Kyle 02/10/2012, 2:51 PM

## 2012-02-10 NOTE — Op Note (Signed)
NAMECECILEE, [name]              ACCOUNT NO.:  [id number]  MEDICAL RECORD NO.:  [id number]  LOCATION:  9320                          FACILITY:  [facility]  PHYSICIAN:  [name], M.D.DATE OF BIRTH:  September 30, 1966  DATE OF PROCEDURE:  02/09/2012 DATE OF DISCHARGE:                              OPERATIVE REPORT   PREOPERATIVE DIAGNOSES:  Previous cesarean section x2, menorrhagia, dysmenorrhea, uterine fibroids.  POSTOPERATIVE DIAGNOSES:  Fibroid uterus, previous cesarean section x2, menorrhagia, dysmenorrhea.  PROCEDURE:  Laparoscopic-assisted vaginal hysterectomy.  ANESTHESIA:  General.  SURGEON:  [name], M.D.  ASSISTANT:  [name], CNM.  PROCEDURE IN DETAIL:  Under adequate general anesthesia, the patient was placed in a dorsal lithotomy position.  Examination under anesthesia revealed about a 10-week size irregular uterus.  No adnexal masses could be appreciated.  The patient was sterilely prepped and draped in usual fashion.  The bivalve speculum was placed in the vagina.  Single-tooth tenaculum was placed on the anterior lip of the cervix.  An acorn cannula was introduced into the cervical os and attached to the tenaculum for manipulation of the uterus.  An indwelling Foley catheter was sterilely placed.  The bivalve speculum was removed.  Attention was then turned to the abdomen.  0.25% Marcaine was injected supraumbilically.  Incision was then made.  Veress needle was inserted, tested, CO2 insufflation was begun, however,flow was intermittent and patchy at best.  Decision was then made to remove  Veress needle. The supraumbilical incision was then made and then was then opened to the parietal peritoneum, and the fascia was then whip stitched with 0 Vicryl suture.  A Hasson cannula was introduced, high-flow was then placed.  A lighted video laparoscope was placed through that port, the pelvis was inspected and entry was without any issues.  The upper  abdomen showed normal liver edge.  There was evidence of a fibroid uterus noted, which was  very distorted, and the ovaries were not initially seen.  Two additional trocar sites were placed in the right and left lower quadrant under direct visualization.  After 0.25% Marcaine was injected and incisions made, two  5-mm ports were placed under direct visualization in the right and left lower quadrant.  The pelvis was  inspected.  Some bladder adhesions of the anterior cul-de-sac was noted.  There was no evidence of endometriosis.  There were clearly multiple fibroids distorting the anatomy.  Both ureters were identified and seen peristalsing.  The fallopian tubes and ovaries were both normal.  However, on the left, the utero-ovarian ligament was clearly shortened and distorted with fibroids present throughout.  The procedure was began with a right round ligament being clamped, cauterized, and cut.  The utero-ovarian ligaments on the right was then serially clamped, cauterized, and then cut, carried down to the level of the bladder reflection.  The uterine vessels were seen.  They were clamped, cauterized, but not cut.  The vesicouterine peritoneum was partially opened.  Attention was then turned to the contralateral side.  The angle was a little bit difficult, but nonetheless, the round ligament was also clamped, cauterized, and cut.  The short utero-ovarian ligaments were carefully clamped, cauterized, and then cut.  The  level of the vesicouterine peritoneum was then reached.  The anterior peritoneum was then opened transversely. The bladder was then carefully sharply dissected off the lower uterine segment.  The bladder was then retrograde with filled 200 mL of fluid just to make sure there was no injury to that bladder, and continued dissection was then performed.  Once there was felt to have been adequate, attention was then turned to the vagina.  The port sites remained, instruments were  removed, and attention was then turned to the vagina.  The acorn cannula and the single-tooth tenaculum was removed.  Weighted speculum was placed.  Sims retractor was placed anteriorly.  The cervix, which was at a old laceration at this 7 o'clock position was grasped anteriorly and posteriorly with Jacobson clamps.  Dilute solution of Pitressin was injected circumferentially at the cervicovaginal junction.  Cervicovaginal junction incision was then made, and the anterior cul-de-sac was initially sent to be open without success. Posteriorly, the posterior cul-de-sac was thought to be open, although appears to be just under the peritoneal surface, which was subsequently then opened transversely.  After that was done, the posterior cuff was oversewn with 0 Vicryl running lock stitch.  The weighted speculum was inserted in the pelvis and the uterosacral ligaments were bilaterally clamped, cut, and suture ligated with 0 Vicryl.  Attempt at identifying the anterior cul-de-sac site using finger from the posterior cul-de-sac was unsuccessful.  Continued sharp dissection was then performed with subsequently opening of the anterior cul-de-sac.  Once this was done, the LigaSure was then used to serially clamp, cauterize, and then cut the uterine vessels, which also have been cauterized abdominally.  The cardinal ligaments and all subsequent attachments of the uterus to the sidewalls were then severed.  The uterus was then carefully removed, and the specimen weighed 285 g.  The small bleeders were cauterized.  The vagina was then closed with 0 Vicryl figure-of-eight sutures in a vertical fashion.  The uterosacral ligament was tied in midline.  There was not much of a posterior cul-de-sac noted and therefore no culdoplasty was performed.  The vaginal cuff was closed completely. Attention was then turned back to the abdomen maintaining sterile technique.  The abdomen was then reinsufflated.  The pelvis  was inspected.  The pedicles were noted to be normal.  There was some bleeding overlying the vaginal cuff closure.  Careful irrigation subsequently targeted.  Cauterization was then performed, then good hemostasis finally achieved.  The abdomen was then copiously irrigated and suctioned.  Surgicel was then placed overlying the pelvis, and the lower ports were then removed under direct visualization.  The Hasson was then removed.  The rectus fascia was closed, and the skin approximated using 4-0 Vicryl subcuticular sutures.  Dermabond placed over those.  Specimen was uterus with cervix sent to Pathology. Estimated blood loss was 200 mL.  Intraoperative fluid was 2 L.  Urine output was 250 mL clear yellow urine.  Sponge and instrument counts x2 was correct.  Complication was none.  The patient tolerated the procedure well, was transferred to recovery in stable condition.     [name], M.D.     [place]/MEDQ  D:  02/09/2012  T:  02/10/2012  Job:  [id number]

## 2012-02-10 NOTE — Progress Notes (Signed)
Subjective: Patient reports tolerating PO and no problems voiding.    Objective: I have reviewed patient's vital signs.  vital signs, intake and output and labs. Filed Vitals:   02/10/12 0616  BP:   Pulse:   Temp:   Resp: 20   I/O last 3 completed shifts: In: 5929.1 [P.O.:600; I.V.:5129.1; IV Piggyback:200] Out: 5450 [Urine:5250; Blood:200] Total I/O In: -  Out: 200 [Urine:200]  Lab Results  Component Value Date   WBC 10.0 02/10/2012   HGB 8.2* 02/10/2012   HCT 25.6* 02/10/2012   MCV 77.6* 02/10/2012   PLT 273 02/10/2012   Lab Results  Component Value Date   CREATININE 0.61 02/10/2012    EXAM General: alert, cooperative and no distress Resp: clear to auscultation bilaterally Cardio: regular rate and rhythm, S1, S2 normal, no murmur, click, rub or gallop GI: soft, non-tender; bowel sounds normal; no masses,  no organomegaly Extremities: no edema, redness or tenderness in the calves or thighs Vaginal Bleeding: minimal  Assessment: s/p Procedure(s): LAPAROSCOPIC ASSISTED VAGINAL HYSTERECTOMY: stable and tolerating diet Will do dulcolax for gas issues Plan: Advance diet Advance to PO medication Discontinue IV fluids Discharge home  LOS: 1 day    Cortlynn Hollinsworth A, MD 02/10/2012 7:45 AM    02/10/2012, 7:45 AM

## 2012-02-10 NOTE — Progress Notes (Signed)
Pt d/c home   Ambulate out  Teaching complete

## 2012-02-14 ENCOUNTER — Encounter (HOSPITAL_COMMUNITY): Payer: Self-pay | Admitting: Obstetrics and Gynecology

## 2012-09-27 ENCOUNTER — Other Ambulatory Visit (HOSPITAL_COMMUNITY)
Admission: RE | Admit: 2012-09-27 | Discharge: 2012-09-27 | Disposition: A | Discharge status: Home / Self Care | Payer: Commercial Managed Care - PPO | Source: Ambulatory Visit | Attending: Family Medicine | Admitting: Family Medicine

## 2012-09-27 ENCOUNTER — Other Ambulatory Visit: Payer: Self-pay | Admitting: Family Medicine

## 2012-09-27 DIAGNOSIS — Z01419 Encounter for gynecological examination (general) (routine) without abnormal findings: Secondary | ICD-10-CM | POA: Insufficient documentation

## 2012-09-27 DIAGNOSIS — Z1151 Encounter for screening for human papillomavirus (HPV): Secondary | ICD-10-CM | POA: Insufficient documentation

## 2013-10-11 ENCOUNTER — Emergency Department (INDEPENDENT_AMBULATORY_CARE_PROVIDER_SITE_OTHER)
Admission: EM | Admit: 2013-10-11 | Discharge: 2013-10-11 | Disposition: A | Discharge status: Home / Self Care | Payer: BC Managed Care – PPO | Source: Home / Self Care | Attending: Emergency Medicine | Admitting: Emergency Medicine

## 2013-10-11 ENCOUNTER — Encounter (HOSPITAL_COMMUNITY): Payer: Self-pay | Admitting: Emergency Medicine

## 2013-10-11 ENCOUNTER — Ambulatory Visit (HOSPITAL_COMMUNITY): Payer: BC Managed Care – PPO | Attending: Emergency Medicine

## 2013-10-11 DIAGNOSIS — M542 Cervicalgia: Secondary | ICD-10-CM | POA: Insufficient documentation

## 2013-10-11 DIAGNOSIS — S161XXA Strain of muscle, fascia and tendon at neck level, initial encounter: Secondary | ICD-10-CM

## 2013-10-11 DIAGNOSIS — S139XXA Sprain of joints and ligaments of unspecified parts of neck, initial encounter: Secondary | ICD-10-CM

## 2013-10-11 DIAGNOSIS — M5030 Other cervical disc degeneration, unspecified cervical region: Secondary | ICD-10-CM | POA: Insufficient documentation

## 2013-10-11 MED ORDER — MELOXICAM 15 MG PO TABS: 15.0000 mg | ORAL_TABLET | Freq: Every day | ORAL | Status: DC

## 2013-10-11 MED ORDER — IBUPROFEN 800 MG PO TABS
800.0000 mg | ORAL_TABLET | Freq: Once | ORAL | Status: AC
  Administered 2013-10-11: 800 mg via ORAL

## 2013-10-11 MED ORDER — TRAMADOL HCL 50 MG PO TABS: 100.0000 mg | ORAL_TABLET | Freq: Three times a day (TID) | ORAL | Status: DC | PRN

## 2013-10-11 MED ORDER — METHOCARBAMOL 500 MG PO TABS: 500.0000 mg | ORAL_TABLET | Freq: Three times a day (TID) | ORAL | Status: DC

## 2013-10-11 MED ORDER — IBUPROFEN 800 MG PO TABS
ORAL_TABLET | ORAL | Status: AC
  Filled 2013-10-11: qty 1
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
## 2014-01-22 ENCOUNTER — Other Ambulatory Visit: Payer: Self-pay | Admitting: Family Medicine

## 2014-01-22 DIAGNOSIS — E010 Iodine-deficiency related diffuse (endemic) goiter: Secondary | ICD-10-CM

## 2014-01-28 ENCOUNTER — Other Ambulatory Visit: Payer: BC Managed Care – PPO

## 2014-07-20 ENCOUNTER — Emergency Department (HOSPITAL_COMMUNITY): Payer: PRIVATE HEALTH INSURANCE

## 2014-07-20 ENCOUNTER — Emergency Department (HOSPITAL_COMMUNITY)
Admission: EM | Admit: 2014-07-20 | Discharge: 2014-07-20 | Disposition: A | Discharge status: Home / Self Care | Payer: PRIVATE HEALTH INSURANCE | Attending: Emergency Medicine | Admitting: Emergency Medicine

## 2014-07-20 ENCOUNTER — Encounter (HOSPITAL_COMMUNITY): Payer: Self-pay | Admitting: Emergency Medicine

## 2014-07-20 DIAGNOSIS — S0993XA Unspecified injury of face, initial encounter: Secondary | ICD-10-CM | POA: Diagnosis present

## 2014-07-20 DIAGNOSIS — Y9389 Activity, other specified: Secondary | ICD-10-CM | POA: Diagnosis not present

## 2014-07-20 DIAGNOSIS — M542 Cervicalgia: Secondary | ICD-10-CM

## 2014-07-20 DIAGNOSIS — Z79899 Other long term (current) drug therapy: Secondary | ICD-10-CM | POA: Insufficient documentation

## 2014-07-20 DIAGNOSIS — Z880 Allergy status to penicillin: Secondary | ICD-10-CM | POA: Insufficient documentation

## 2014-07-20 DIAGNOSIS — Y92410 Unspecified street and highway as the place of occurrence of the external cause: Secondary | ICD-10-CM | POA: Insufficient documentation

## 2014-07-20 DIAGNOSIS — S199XXA Unspecified injury of neck, initial encounter: Principal | ICD-10-CM

## 2014-07-20 DIAGNOSIS — Z791 Long term (current) use of non-steroidal anti-inflammatories (NSAID): Secondary | ICD-10-CM | POA: Insufficient documentation

## 2014-07-20 DIAGNOSIS — E785 Hyperlipidemia, unspecified: Secondary | ICD-10-CM | POA: Insufficient documentation

## 2014-07-20 DIAGNOSIS — IMO0002 Reserved for concepts with insufficient information to code with codable children: Secondary | ICD-10-CM | POA: Diagnosis not present

## 2014-07-20 DIAGNOSIS — S298XXA Other specified injuries of thorax, initial encounter: Secondary | ICD-10-CM | POA: Diagnosis not present

## 2014-07-20 DIAGNOSIS — F17200 Nicotine dependence, unspecified, uncomplicated: Secondary | ICD-10-CM | POA: Insufficient documentation

## 2014-07-20 LAB — SAMPLE TO BLOOD BANK

## 2014-07-20 LAB — COMPREHENSIVE METABOLIC PANEL
ALK PHOS: 76 U/L (ref 39–117)
ALT: 21 U/L (ref 0–35)
AST: 22 U/L (ref 0–37)
Albumin: 3.7 g/dL (ref 3.5–5.2)
Anion gap: 14 (ref 5–15)
BUN: 15 mg/dL (ref 6–23)
CALCIUM: 9.5 mg/dL (ref 8.4–10.5)
CO2: 19 mEq/L (ref 19–32)
Chloride: 102 mEq/L (ref 96–112)
Creatinine, Ser: 0.67 mg/dL (ref 0.50–1.10)
GFR calc Af Amer: >90 mL/min (ref >90)
GFR calc non Af Amer: >90 mL/min (ref >90)
Glucose, Bld: 91 mg/dL (ref 70–99)
POTASSIUM: 4 meq/L (ref 3.7–5.3)
Sodium: 135 mEq/L — ABNORMAL LOW (ref 137–147)
TOTAL PROTEIN: 7.7 g/dL (ref 6.0–8.3)
Total Bilirubin: 0.2 mg/dL — ABNORMAL LOW (ref 0.3–1.2)

## 2014-07-20 LAB — PROTIME-INR
INR: 0.98 (ref 0.00–1.49)
Prothrombin Time: 13 seconds (ref 11.6–15.2)

## 2014-07-20 LAB — CBC
HEMATOCRIT: 40.4 % (ref 36.0–46.0)
Hemoglobin: 13.3 g/dL (ref 12.0–15.0)
MCH: 30.4 pg (ref 26.0–34.0)
MCHC: 32.9 g/dL (ref 30.0–36.0)
MCV: 92.2 fL (ref 78.0–100.0)
Platelets: 276 10*3/uL (ref 150–400)
RBC: 4.38 MIL/uL (ref 3.87–5.11)
RDW: 12.9 % (ref 11.5–15.5)
WBC: 5 10*3/uL (ref 4.0–10.5)

## 2014-07-20 LAB — CDS SEROLOGY

## 2014-07-20 LAB — ETHANOL: Alcohol, Ethyl (B): <11 mg/dL (ref 0–11)

## 2014-07-20 MED ORDER — IOHEXOL 300 MG/ML  SOLN
100.0000 mL | Freq: Once | INTRAMUSCULAR | Status: AC | PRN
Start: 2014-07-20 — End: 2014-07-20
  Administered 2014-07-20: 100 mL via INTRAVENOUS

## 2014-07-20 MED ORDER — MORPHINE SULFATE 4 MG/ML IJ SOLN
4.0000 mg | Freq: Once | INTRAMUSCULAR | Status: AC
  Administered 2014-07-20: 4 mg via INTRAVENOUS
  Filled 2014-07-20: qty 1

## 2014-07-20 MED ORDER — OXYCODONE-ACETAMINOPHEN 5-325 MG PO TABS: 1.0000 | ORAL_TABLET | ORAL | Status: DC | PRN
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
## 2014-07-20 NOTE — ED Notes (Signed)
[place] Collar placed per EDP order.

## 2014-07-20 NOTE — ED Notes (Signed)
Portable at the bedside.  

## 2016-01-12 DIAGNOSIS — J309 Allergic rhinitis, unspecified: Secondary | ICD-10-CM | POA: Insufficient documentation

## 2016-01-12 DIAGNOSIS — G43909 Migraine, unspecified, not intractable, without status migrainosus: Secondary | ICD-10-CM | POA: Insufficient documentation

## 2016-11-21 ENCOUNTER — Encounter (HOSPITAL_COMMUNITY): Payer: Self-pay

## 2016-11-21 ENCOUNTER — Emergency Department (HOSPITAL_COMMUNITY)
Admission: EM | Admit: 2016-11-21 | Discharge: 2016-11-21 | Disposition: A | Discharge status: Home / Self Care | Payer: Managed Care, Other (non HMO) | Attending: Emergency Medicine | Admitting: Emergency Medicine

## 2016-11-21 ENCOUNTER — Emergency Department (HOSPITAL_COMMUNITY): Payer: Managed Care, Other (non HMO)

## 2016-11-21 DIAGNOSIS — R519 Headache, unspecified: Secondary | ICD-10-CM

## 2016-11-21 DIAGNOSIS — R0789 Other chest pain: Secondary | ICD-10-CM

## 2016-11-21 DIAGNOSIS — Z79899 Other long term (current) drug therapy: Secondary | ICD-10-CM | POA: Insufficient documentation

## 2016-11-21 DIAGNOSIS — R51 Headache: Secondary | ICD-10-CM | POA: Diagnosis not present

## 2016-11-21 DIAGNOSIS — F17210 Nicotine dependence, cigarettes, uncomplicated: Secondary | ICD-10-CM | POA: Insufficient documentation

## 2016-11-21 DIAGNOSIS — R079 Chest pain, unspecified: Secondary | ICD-10-CM | POA: Diagnosis present

## 2016-11-21 LAB — CBC
HCT: 39 % (ref 36.0–46.0)
Hemoglobin: 13.3 g/dL (ref 12.0–15.0)
MCH: 31.2 pg (ref 26.0–34.0)
MCHC: 34.1 g/dL (ref 30.0–36.0)
MCV: 91.5 fL (ref 78.0–100.0)
PLATELETS: 302 10*3/uL (ref 150–400)
RBC: 4.26 MIL/uL (ref 3.87–5.11)
RDW: 13.9 % (ref 11.5–15.5)
WBC: 7 10*3/uL (ref 4.0–10.5)

## 2016-11-21 LAB — BASIC METABOLIC PANEL
Anion gap: 10 (ref 5–15)
BUN: 15 mg/dL (ref 6–20)
CHLORIDE: 104 mmol/L (ref 101–111)
CO2: 23 mmol/L (ref 22–32)
CREATININE: 0.7 mg/dL (ref 0.44–1.00)
Calcium: 9.3 mg/dL (ref 8.9–10.3)
Glucose, Bld: 122 mg/dL — ABNORMAL HIGH (ref 65–99)
Potassium: 3.8 mmol/L (ref 3.5–5.1)
SODIUM: 137 mmol/L (ref 135–145)

## 2016-11-21 LAB — I-STAT TROPONIN, ED: TROPONIN I, POC: 0 ng/mL (ref 0.00–0.08)

## 2016-11-21 MED ORDER — SODIUM CHLORIDE 0.9 % IV BOLUS (SEPSIS)
1000.0000 mL | Freq: Once | INTRAVENOUS | Status: AC
  Administered 2016-11-21: 1000 mL via INTRAVENOUS

## 2016-11-21 MED ORDER — DIPHENHYDRAMINE HCL 50 MG/ML IJ SOLN
25.0000 mg | Freq: Once | INTRAMUSCULAR | Status: AC
  Administered 2016-11-21: 25 mg via INTRAVENOUS
  Filled 2016-11-21: qty 1

## 2016-11-21 MED ORDER — KETOROLAC TROMETHAMINE 30 MG/ML IJ SOLN
30.0000 mg | Freq: Once | INTRAMUSCULAR | Status: AC
  Administered 2016-11-21: 30 mg via INTRAVENOUS
  Filled 2016-11-21: qty 1

## 2016-11-21 MED ORDER — MORPHINE SULFATE (PF) 4 MG/ML IV SOLN
4.0000 mg | Freq: Once | INTRAVENOUS | Status: AC
  Administered 2016-11-21: 4 mg via INTRAVENOUS
  Filled 2016-11-21: qty 1

## 2016-11-21 MED ORDER — METOCLOPRAMIDE HCL 5 MG/ML IJ SOLN
10.0000 mg | Freq: Once | INTRAMUSCULAR | Status: AC
  Administered 2016-11-21: 10 mg via INTRAVENOUS
  Filled 2016-11-21: qty 2

## 2016-11-21 NOTE — ED Provider Notes (Signed)
[facility] DEPT Provider Note   CSN: [id number] Arrival date & time: 11/21/16  0050   By signing my name below, I, [name], attest that this documentation has been prepared under the direction and in the presence of [name], MD. Electronically signed, [name], ED Scribe. 11/21/16. 4:05 AM.   History   Chief Complaint Chief Complaint  Patient presents with  . Chest Pain   The history is provided by the patient, the spouse and medical records.    HPI Comments: [name] is a 50 y.o. female who presents to the Emergency Department complaining of persistent, 8/10 left-sided chest pain since ~11:00AM 11/20/2016. She states her pain radiates to her left sided abdomen, neck, head, and left arm. Pt reports associated dry cough and light headedness. Pt notes treatments tried at home and no modifying factors. Pt is a smoker. Pt denies fever, chills, diaphoresis, N/V/D, photophobia or visual disturbances, and ETOH use. PCP Dr. Gwenyth Allegra.  Past Medical History:  Diagnosis Date  . Chest pain 2008   neg work up- dx'd with stress  . Hyperlipidemia   . No pertinent past medical history     Patient Active Problem List   Diagnosis Date Noted  . HYPERLIPIDEMIA 01/05/2011  . EAR PAIN, LEFT 03/09/2009  . ELBOW PAIN 03/09/2009    Past Surgical History:  Procedure Laterality Date  . BREAST SURGERY    . CESAREAN SECTION     x 2  . HAMMER TOE SURGERY    . LAPAROSCOPIC ASSISTED VAGINAL HYSTERECTOMY  02/09/2012   Procedure: LAPAROSCOPIC ASSISTED VAGINAL HYSTERECTOMY;  Surgeon: Marvene Staff, MD;  Location: [facility];  Service: Gynecology;  Laterality: N/A;  Requests 2 hrs.  Also wants a 3 way foley.    OB History    No data available       Home Medications    Prior to Admission medications   Medication Sig Start Date End Date Taking? Authorizing Provider  albuterol (PROVENTIL HFA;VENTOLIN HFA) 108 (90 BASE) MCG/ACT inhaler Inhale 2 puffs into the lungs  every 6 (six) hours as needed for wheezing or shortness of breath.    Historical Provider, MD  azelastine (ASTELIN) 0.1 % nasal spray Place 1 spray into both nostrils 2 (two) times daily. Use in each nostril as directed    Historical Provider, MD  azelastine (OPTIVAR) 0.05 % ophthalmic solution Place 1 drop into both eyes 2 (two) times daily.    Historical Provider, MD  montelukast (SINGULAIR) 10 MG tablet Take 10 mg by mouth at bedtime.    Historical Provider, MD  oxyCODONE-acetaminophen (ROXICET) 5-325 MG per tablet Take 1 tablet by mouth every 4 (four) hours as needed for severe pain. 07/20/14   Serita Grit, MD    Family History History reviewed. No pertinent family history.  Social History Social History  Substance Use Topics  . Smoking status: Current Every Day Smoker    Packs/day: 0.25    Years: 15.00    Types: Cigarettes  . Smokeless tobacco: Not on file  . Alcohol use No     Allergies   Aspirin and Penicillins   Review of Systems Review of Systems  Constitutional: Negative for chills, diaphoresis and fever.  Eyes: Negative for photophobia and visual disturbance.  Respiratory: Positive for cough.   Cardiovascular: Positive for chest pain.  Gastrointestinal: Negative for diarrhea, nausea and vomiting.  Musculoskeletal: Positive for myalgias.  Neurological: Positive for light-headedness and headaches.     Physical Exam Updated Vital Signs BP  162/89 (BP Location: Left Arm)   Pulse 73   Temp 98 F (36.7 C) (Oral)   Resp 18   Ht 5\' 6"  (1.676 m)   Wt 182 lb (82.6 kg)   LMP 01/18/2012   SpO2 98%   BMI 29.38 kg/m   Physical Exam  Constitutional: She is oriented to person, place, and time. She appears well-developed and well-nourished.  HENT:  Head: Normocephalic and atraumatic.  Eyes: EOM are normal. Pupils are equal, round, and reactive to light.  Neck: Normal range of motion. Neck supple. No JVD present.  Cardiovascular: Normal rate, regular rhythm and  normal heart sounds.   No murmur heard. Pulmonary/Chest: Effort normal and breath sounds normal. She has no wheezes. She has no rales. She exhibits tenderness.  Tender over left anterior chest wall.  Abdominal: Soft. Bowel sounds are normal. She exhibits no distension and no mass. There is no tenderness.  Musculoskeletal: Normal range of motion. She exhibits tenderness. She exhibits no edema.  Tender over temperalis muscles bilaterally and over insertion of paracervical muscles bilaterally.  Lymphadenopathy:    She has no cervical adenopathy.  Neurological: She is alert and oriented to person, place, and time. No cranial nerve deficit. She exhibits normal muscle tone. Coordination normal.  Skin: Skin is warm and dry. No rash noted.  Psychiatric: She has a normal mood and affect. Her behavior is normal. Judgment and thought content normal.  Nursing note and vitals reviewed.    ED Treatments / Results  DIAGNOSTIC STUDIES: Oxygen Saturation is 98% on RA, normal by my interpretation.    COORDINATION OF CARE: 4:05 AM Discussed treatment plan with pt at bedside and pt agreed to plan.  Labs (all labs ordered are listed, but only abnormal results are displayed) Labs Reviewed  BASIC METABOLIC PANEL - Abnormal; Notable for the following:       Result Value   Glucose, Bld 122 (*)    All other components within normal limits  CBC  I-STAT TROPOININ, ED    EKG  EKG Interpretation  Date/Time:  Monday November 21 2016 00:57:20 EST Ventricular Rate:  70 PR Interval:    QRS Duration: 92 QT Interval:  365 QTC Calculation: 394 R Axis:   64 Text Interpretation:  Sinus rhythm Borderline repolarization abnormality Minimal ST elevation, lateral leads Baseline wander in lead(s) V2 When compared with ECG of 07/20/2014, No significant change was found Confirmed by [facility]  MD, Jameka Ivie ([id number]) on 11/21/2016 1:04:54 AM       Radiology Dg Chest 2 View  Result Date: 11/21/2016 CLINICAL DATA:  Acute  central chest pain radiating to the left side upper chest since earlier today. EXAM: CHEST  2 VIEW COMPARISON:  07/20/2014 CXR and CT FINDINGS: The heart size and mediastinal contours are within normal limits. Both lungs are clear. The visualized skeletal structures are unremarkable. Chronic mild elevation the right hemidiaphragm. IMPRESSION: No active cardiopulmonary disease. Electronically Signed   By: Ashley Royalty M.D.   On: 11/21/2016 02:13    Procedures Procedures (including critical care time)  Medications Ordered in ED Medications  sodium chloride 0.9 % bolus 1,000 mL (1,000 mLs Intravenous New Bag/Given 11/21/16 0311)  ketorolac (TORADOL) 30 MG/ML injection 30 mg (30 mg Intravenous Given 11/21/16 0312)  metoCLOPramide (REGLAN) injection 10 mg (10 mg Intravenous Given 11/21/16 0311)  diphenhydrAMINE (BENADRYL) injection 25 mg (25 mg Intravenous Given 11/21/16 0313)     Initial Impression / Assessment and Plan / ED Course  I have reviewed the  triage vital signs and the nursing notes.  Pertinent labs & imaging results that were available during my care of the patient were reviewed by me and considered in my medical decision making (see chart for details).  Clinical Course    Chest pain which appears to be chest wall pain based on exam. Headache which seems to be muscle contraction headache based on tenderness of temporalis muscle and paracervical muscles. Patient is nontoxic in appearance. ECG and chest x-ray are unremarkable. Laboratory workup is unremarkable. She is given a headache cocktail of normal saline, metoclopramide, diphenhydramine, ketorolac with excellent relief of chest pain but headache persisted. She was given a single dose of morphine 4 mg with complete relief of headache. No red flags to suggest serious causes of headache. She is discharged with instructions to use over-the-counter NSAIDs. Old records were reviewed, and she has no relevant past visits.  Final Clinical  Impressions(s) / ED Diagnoses   Final diagnoses:  Chest wall pain  Headache, unspecified headache type    New Prescriptions Current Discharge Medication List     I personally performed the services described in this documentation, which was scribed in my presence. The recorded information has been reviewed and is accurate.       [name], MD [phone]

## 2016-11-21 NOTE — Discharge Instructions (Signed)
Take ibuprofen or naproxen as needed for pain.

## 2016-11-21 NOTE — ED Triage Notes (Signed)
Pt reports acute centralized chest pain that radiates to the left side of her chest that started earlier today. She states that her L side is tingling. She also endorses a severe headache and near syncope earlier today. Pt A&Ox4. Ambulatory.

## 2018-01-10 IMAGING — CR DG CHEST 2V
2 series · 2 of 2 positions shown · non-contrast
Comparison: 07/20/2014 CXR and CT

CLINICAL DATA: Acute central chest pain radiating to the left side
upper chest since earlier today.

EXAM:
CHEST  2 VIEW

[w chest pa]
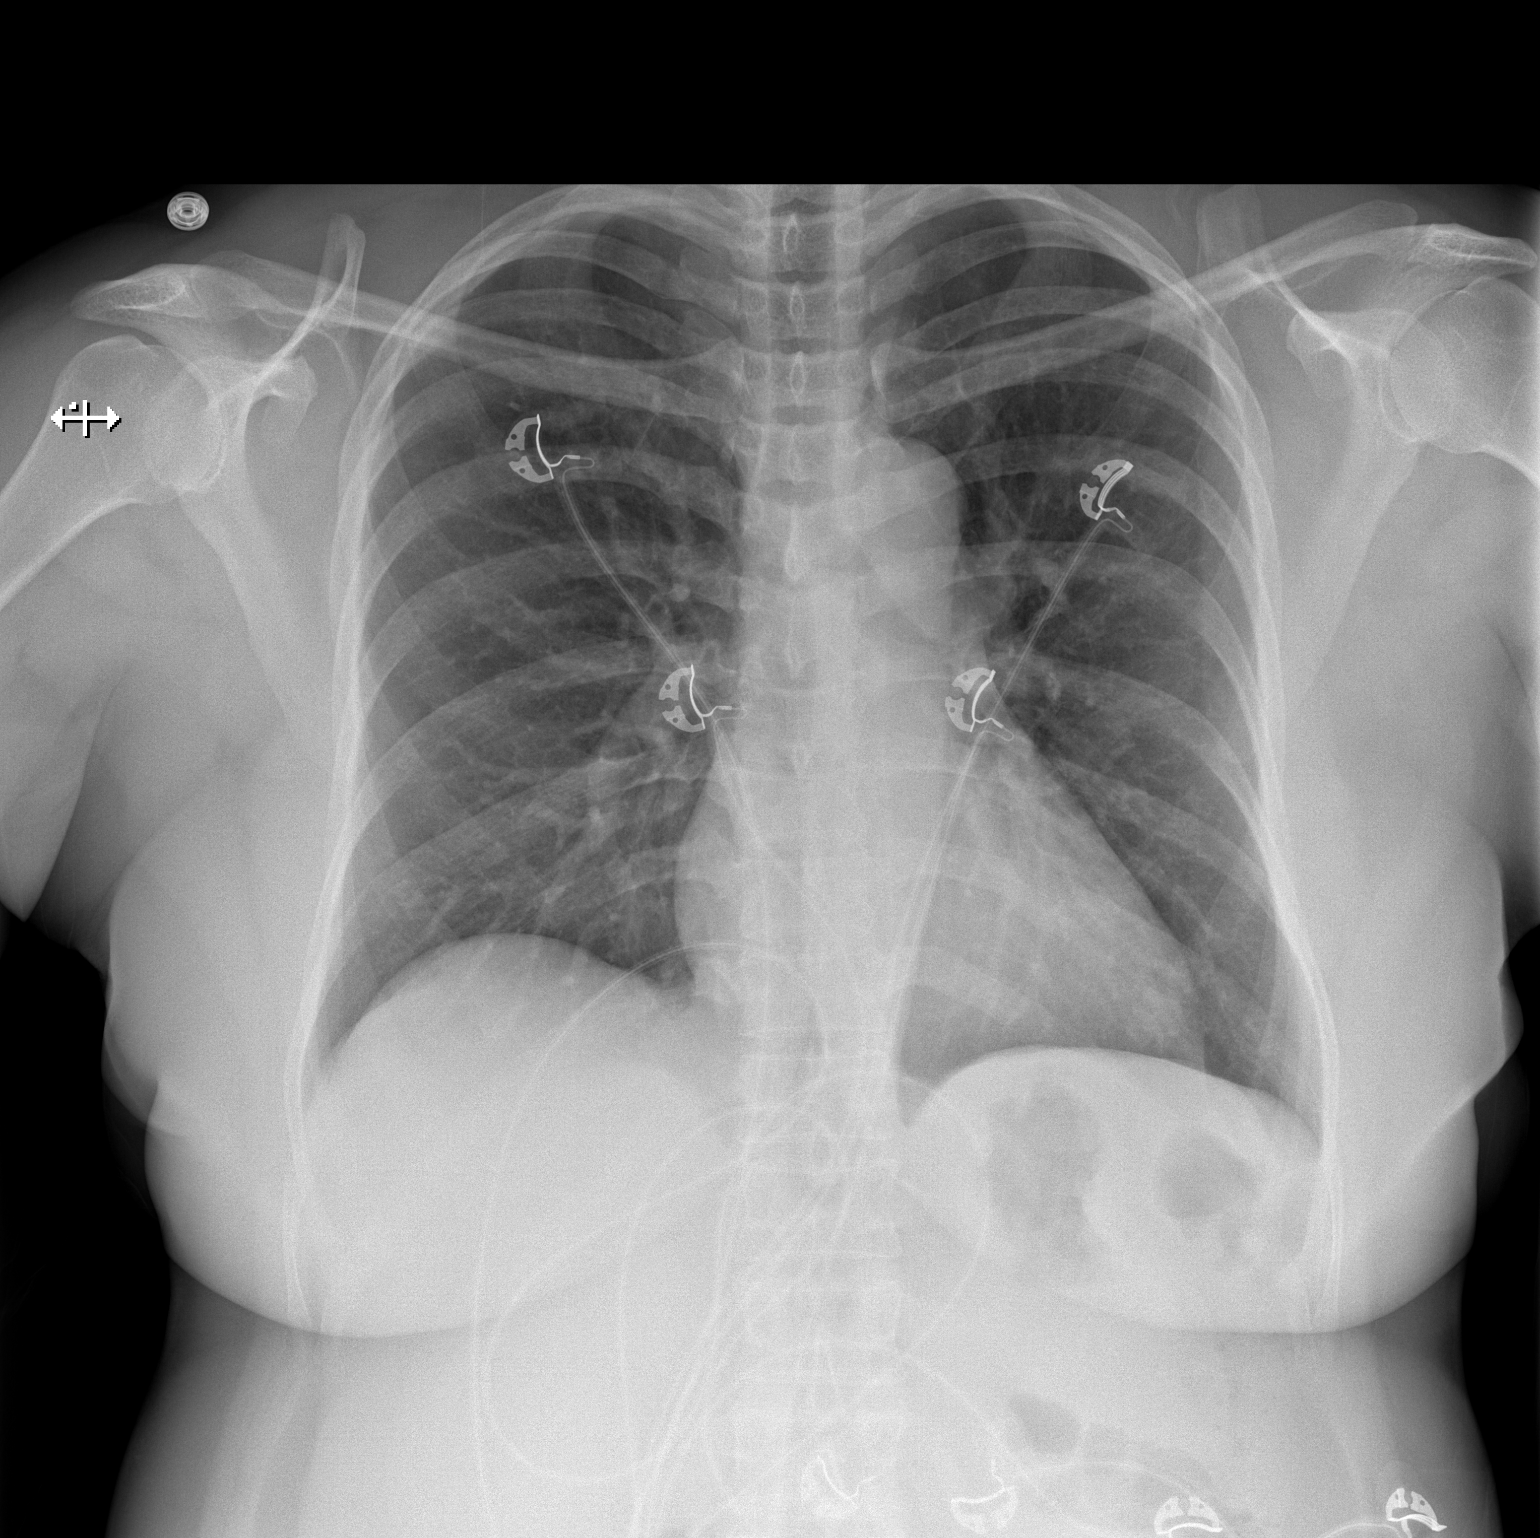

[w chest lat]
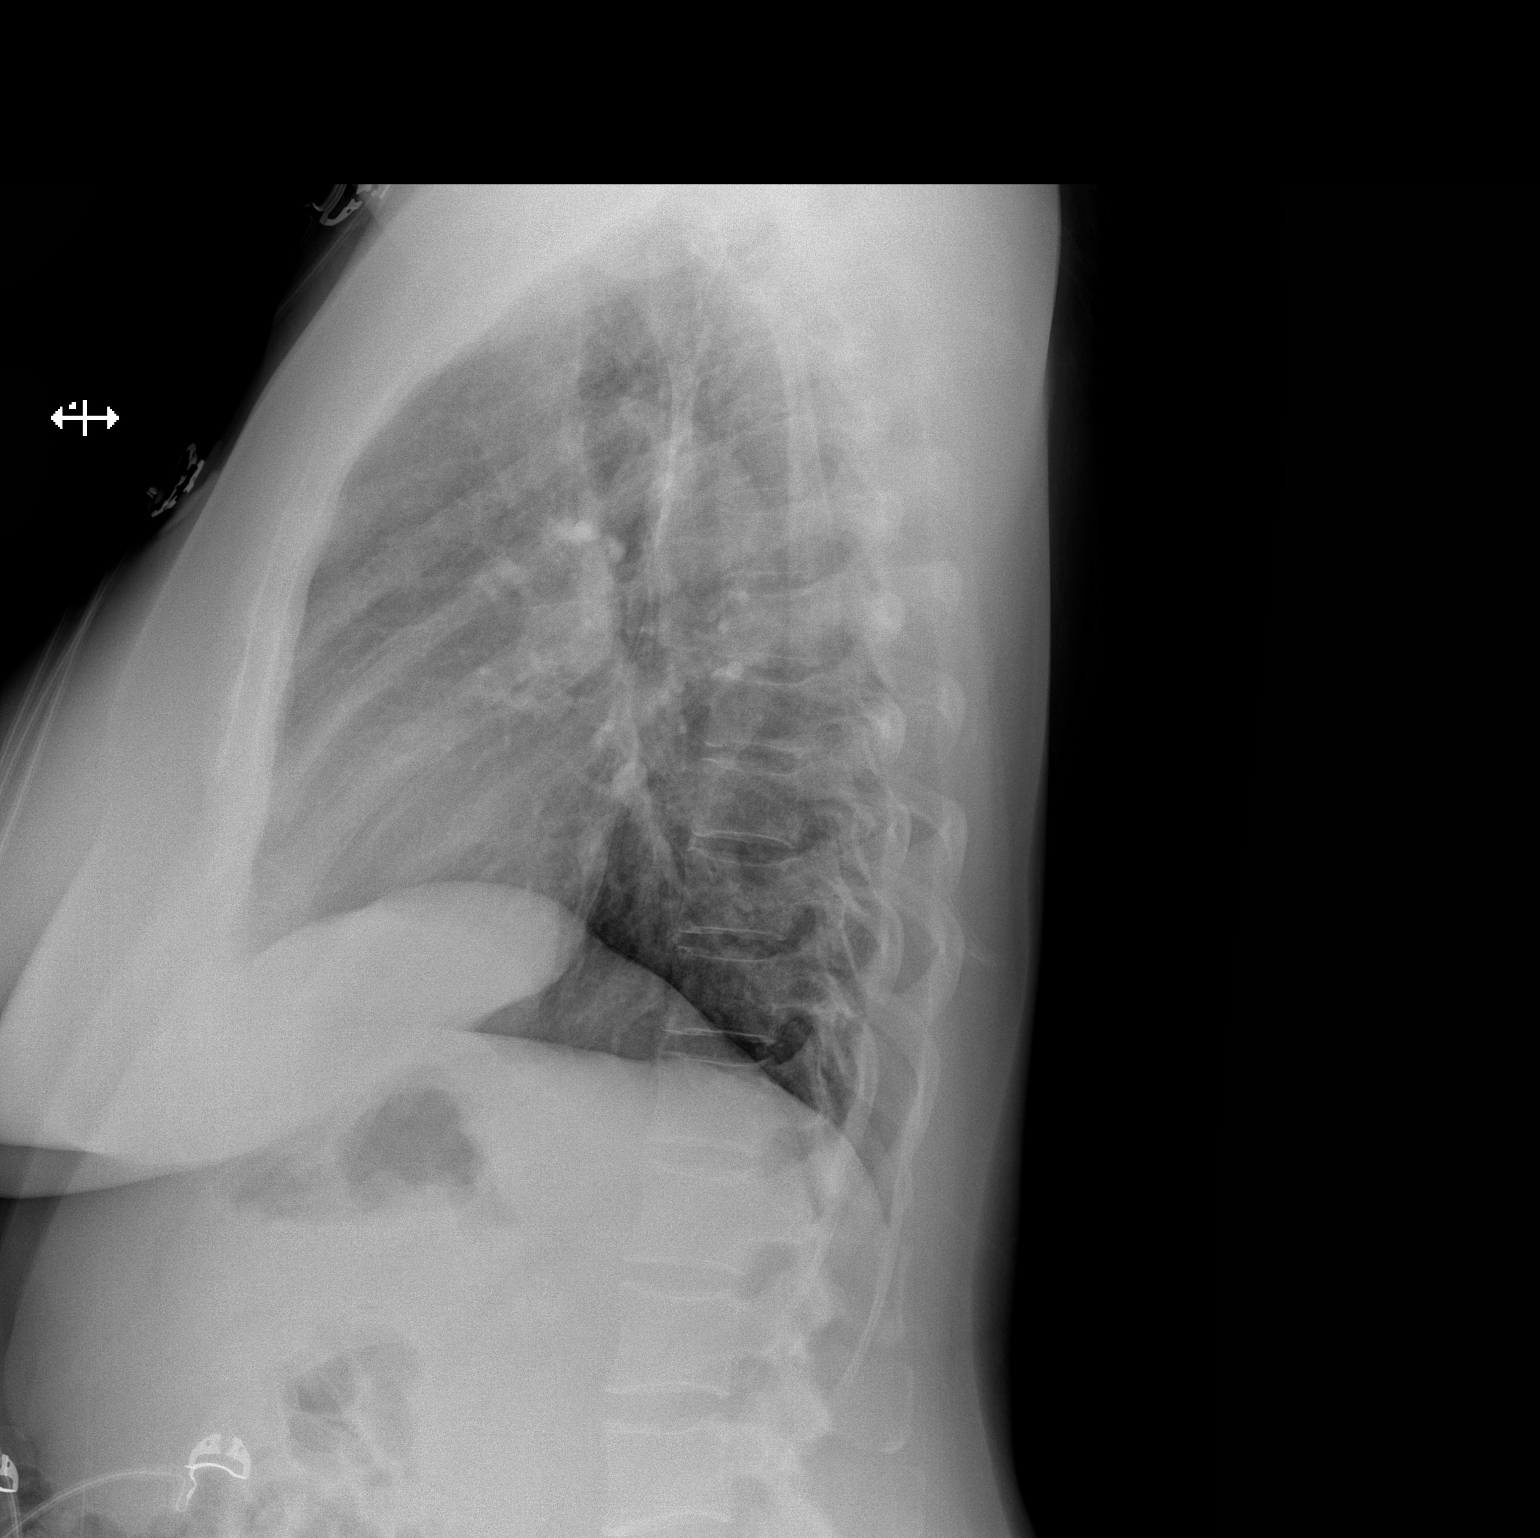

[2 of 2 positions shown; findings below may reference images not displayed]

FINDINGS: The heart size and mediastinal contours are within normal limits.
Both lungs are clear. The visualized skeletal structures are
unremarkable. Chronic mild elevation the right hemidiaphragm.
IMPRESSION: No active cardiopulmonary disease.

## 2018-01-25 ENCOUNTER — Encounter (HOSPITAL_COMMUNITY): Payer: Self-pay | Admitting: *Deleted

## 2018-01-25 ENCOUNTER — Emergency Department (HOSPITAL_COMMUNITY): Payer: 59

## 2018-01-25 ENCOUNTER — Emergency Department (HOSPITAL_COMMUNITY)
Admission: EM | Admit: 2018-01-25 | Discharge: 2018-01-25 | Disposition: A | Discharge status: Home / Self Care | Payer: 59 | Attending: Emergency Medicine | Admitting: Emergency Medicine

## 2018-01-25 DIAGNOSIS — R0789 Other chest pain: Secondary | ICD-10-CM | POA: Insufficient documentation

## 2018-01-25 DIAGNOSIS — Z79899 Other long term (current) drug therapy: Secondary | ICD-10-CM | POA: Diagnosis not present

## 2018-01-25 DIAGNOSIS — J40 Bronchitis, not specified as acute or chronic: Secondary | ICD-10-CM | POA: Insufficient documentation

## 2018-01-25 DIAGNOSIS — R05 Cough: Secondary | ICD-10-CM | POA: Diagnosis present

## 2018-01-25 DIAGNOSIS — F17210 Nicotine dependence, cigarettes, uncomplicated: Secondary | ICD-10-CM | POA: Diagnosis not present

## 2018-01-25 DIAGNOSIS — Z720 Tobacco use: Secondary | ICD-10-CM

## 2018-01-25 MED ORDER — PREDNISONE 20 MG PO TABS: 20.0000 mg | ORAL_TABLET | Freq: Two times a day (BID) | ORAL | 0 refills | Status: DC

## 2018-01-25 NOTE — ED Provider Notes (Signed)
[facility] DEPT Provider Note   CSN: [id number] Arrival date & time: 01/25/18  0910     History   Chief Complaint Chief Complaint  Patient presents with  . Cough    HPI [name] is a 52 y.o. female.  She is here for evaluation of left sided chest discomfort present for 3 days, waxing and waning, without preventing her from sleeping.  She was recently treated for "sinus infection," which was manifested by rhinorrhea, sneezing, coughing, and general achiness.  She denies fever.  She denies productive cough at this time.  She denies shortness of breath.  There is been no nausea, vomiting, weakness or dizziness.  There are no other known modifying factors.  HPI  Past Medical History:  Diagnosis Date  . Chest pain 2008   neg work up- dx'd with stress  . Hyperlipidemia   . No pertinent past medical history     Patient Active Problem List   Diagnosis Date Noted  . HYPERLIPIDEMIA 01/05/2011  . EAR PAIN, LEFT 03/09/2009  . ELBOW PAIN 03/09/2009    Past Surgical History:  Procedure Laterality Date  . BREAST SURGERY    . CESAREAN SECTION     x 2  . HAMMER TOE SURGERY    . LAPAROSCOPIC ASSISTED VAGINAL HYSTERECTOMY  02/09/2012   Procedure: LAPAROSCOPIC ASSISTED VAGINAL HYSTERECTOMY;  Surgeon: Marvene Staff, MD;  Location: [facility];  Service: Gynecology;  Laterality: N/A;  Requests 2 hrs.  Also wants a 3 way foley.    OB History    No data available       Home Medications    Prior to Admission medications   Medication Sig Start Date End Date Taking? Authorizing Provider  albuterol (PROVENTIL HFA;VENTOLIN HFA) 108 (90 BASE) MCG/ACT inhaler Inhale 2 puffs into the lungs every 6 (six) hours as needed for wheezing or shortness of breath.   Yes [provider]  atorvastatin (LIPITOR) 40 MG tablet Take 40 mg by mouth daily.   Yes [provider]  azelastine (ASTELIN) 0.1 % nasal spray Place 1 spray into both nostrils  2 (two) times daily as needed for rhinitis or allergies. Use in each nostril as directed   Yes [provider]  ibuprofen (ADVIL,MOTRIN) 800 MG tablet Take 800 mg by mouth every 8 (eight) hours as needed for moderate pain.   Yes [provider]  montelukast (SINGULAIR) 10 MG tablet Take 10 mg by mouth at bedtime as needed (allergies).    Yes [provider]  predniSONE (DELTASONE) 20 MG tablet Take 1 tablet (20 mg total) by mouth 2 (two) times daily. 01/25/18   [name], MD    Family History No family history on file.  Social History Social History   Tobacco Use  . Smoking status: Current Every Day Smoker    Packs/day: 0.25    Years: 15.00    Pack years: 3.75    Types: Cigarettes  Substance Use Topics  . Alcohol use: No  . Drug use: No     Allergies   Aspirin and Penicillins   Review of Systems Review of Systems  All other systems reviewed and are negative.    Physical Exam Updated Vital Signs BP (!) 150/89 (BP Location: Right Arm)   Pulse 76   Temp 98.4 F (36.9 C) (Oral)   Resp 17   LMP 01/18/2012   SpO2 100%   Physical Exam  Constitutional: She is oriented to person, place, and time. She  appears well-developed and well-nourished.  HENT:  Head: Normocephalic and atraumatic.  Eyes: Conjunctivae and EOM are normal. Pupils are equal, round, and reactive to light.  Neck: Normal range of motion and phonation normal. Neck supple.  Cardiovascular: Normal rate and regular rhythm.  Pulmonary/Chest: Effort normal and breath sounds normal. No stridor. No respiratory distress. She has no wheezes. She exhibits tenderness (Mild left lower lateral chest wall discomfort to palpation.).  Abdominal: Soft. She exhibits no distension. There is no tenderness. There is no guarding.  Genitourinary:  Genitourinary Comments: No costovertebral angle tenderness with percussion.  Musculoskeletal: Normal range of motion.  Neurological: She is alert and  oriented to person, place, and time. She exhibits normal muscle tone.  Skin: Skin is warm and dry.  Psychiatric: She has a normal mood and affect. Her behavior is normal. Judgment and thought content normal.  Nursing note and vitals reviewed.    ED Treatments / Results  Labs (all labs ordered are listed, but only abnormal results are displayed) Labs Reviewed - No data to display  EKG  EKG Interpretation None       Radiology Dg Chest 2 View  Result Date: 01/25/2018 CLINICAL DATA:  Chest pain.  Cough. EXAM: CHEST  2 VIEW COMPARISON:  Radiographs of November 21, 2016. FINDINGS: The heart size and mediastinal contours are within normal limits. Both lungs are clear. No pneumothorax or pleural effusion is noted. The visualized skeletal structures are unremarkable. IMPRESSION: No active cardiopulmonary disease. Electronically Signed   By: Marijo Conception, M.D.   On: 01/25/2018 13:03    Procedures Procedures (including critical care time)  Medications Ordered in ED Medications - No data to display   Initial Impression / Assessment and Plan / ED Course  I have reviewed the triage vital signs and the nursing notes.  Pertinent labs & imaging results that were available during my care of the patient were reviewed by me and considered in my medical decision making (see chart for details).      Patient Vitals for the past 24 hrs:  BP Temp Temp src Pulse Resp SpO2  01/25/18 1340 (!) 150/89 - - 76 17 100 %  01/25/18 1157 (!) 151/92 98.4 F (36.9 C) Oral 73 18 100 %  01/25/18 0925 (!) 166/96 98.3 F (36.8 C) Oral 83 18 94 %    At D/C Reevaluation with update and discussion. After initial assessment and treatment, an updated evaluation reveals she remains comfortable and has no further complaints.  Findings discussed with patient and all questions were answered. [name]      Final Clinical Impressions(s) / ED Diagnoses   Final diagnoses:  Bronchitis  Tobacco abuse    Chest pain nonspecific but likely associated with element of bronchitis post recent upper respiratory infection/sinusitis.  Screening with chest x-ray does not indicate pneumonia, pneumothorax or rib fracture.  Recheck indicate mild persistent hypertension.  Doubt ACS, PE or pneumonia.  Nursing Notes Reviewed/ Care Coordinated Applicable Imaging Reviewed Interpretation of Laboratory Data incorporated into ED treatment  The patient appears reasonably screened and/or stabilized for discharge and I doubt any other medical condition or other [facility] requiring further screening, evaluation, or treatment in the ED at this time prior to discharge.  Plan: Home Medications-OTC analgesia as needed; Home Treatments-stop smoking.; return here if the recommended treatment, does not improve the symptoms; Recommended follow up-PCP follow-up 1 or 2 weeks for reevaluation and further treatment and assessment.   ED Discharge Orders  Ordered    predniSONE (DELTASONE) 20 MG tablet  2 times daily     01/25/18 1330       [name], MD 01/25/18 1950

## 2018-01-25 NOTE — ED Notes (Signed)
ED Provider at bedside. 

## 2018-01-25 NOTE — ED Triage Notes (Signed)
Pt complains of left sided rib cage pain, cough, for the past 3 days. Pt states she had sinus infection 2 weeks ago, tested negative for the flu. Pt states she finished abx for sinus infection but does not feel better.

## 2018-01-25 NOTE — Discharge Instructions (Signed)
Your cough and chest discomfort are likely secondary to bronchitis which is most likely from smoking.  Try to cut back on cigarettes.  For cough use Robitussin-DM.  We are giving her a short prescription of prednisone to improve your discomfort.  Make sure you are getting plenty of rest, drinking a lot of fluids, and return here if needed for problems.

## 2018-03-15 DIAGNOSIS — E6609 Other obesity due to excess calories: Secondary | ICD-10-CM | POA: Insufficient documentation

## 2018-03-15 DIAGNOSIS — K219 Gastro-esophageal reflux disease without esophagitis: Secondary | ICD-10-CM | POA: Insufficient documentation

## 2018-03-27 DIAGNOSIS — L600 Ingrowing nail: Secondary | ICD-10-CM | POA: Insufficient documentation

## 2018-08-02 ENCOUNTER — Emergency Department (HOSPITAL_COMMUNITY)
Admission: EM | Admit: 2018-08-02 | Discharge: 2018-08-02 | Disposition: A | Discharge status: Home / Self Care | Payer: PRIVATE HEALTH INSURANCE | Attending: Emergency Medicine | Admitting: Emergency Medicine

## 2018-08-02 ENCOUNTER — Emergency Department (HOSPITAL_COMMUNITY): Payer: PRIVATE HEALTH INSURANCE

## 2018-08-02 ENCOUNTER — Encounter (HOSPITAL_COMMUNITY): Payer: Self-pay | Admitting: *Deleted

## 2018-08-02 DIAGNOSIS — Z79899 Other long term (current) drug therapy: Secondary | ICD-10-CM | POA: Diagnosis not present

## 2018-08-02 DIAGNOSIS — R202 Paresthesia of skin: Secondary | ICD-10-CM | POA: Diagnosis not present

## 2018-08-02 DIAGNOSIS — M791 Myalgia, unspecified site: Secondary | ICD-10-CM | POA: Diagnosis present

## 2018-08-02 DIAGNOSIS — R0789 Other chest pain: Secondary | ICD-10-CM

## 2018-08-02 LAB — BASIC METABOLIC PANEL
Anion gap: 9 (ref 5–15)
BUN: 14 mg/dL (ref 6–20)
CO2: 26 mmol/L (ref 22–32)
Calcium: 9.4 mg/dL (ref 8.9–10.3)
Chloride: 104 mmol/L (ref 98–111)
Creatinine, Ser: 0.57 mg/dL (ref 0.44–1.00)
GFR calc Af Amer: >60 mL/min (ref >60)
GFR calc non Af Amer: >60 mL/min (ref >60)
Glucose, Bld: 90 mg/dL (ref 70–99)
Potassium: 3.9 mmol/L (ref 3.5–5.1)
Sodium: 139 mmol/L (ref 135–145)

## 2018-08-02 LAB — CBC WITH DIFFERENTIAL/PLATELET
Basophils Absolute: 0 10*3/uL (ref 0.0–0.1)
Basophils Relative: 0 %
Eosinophils Absolute: 0.3 10*3/uL (ref 0.0–0.7)
Eosinophils Relative: 4 %
HCT: 39.3 % (ref 36.0–46.0)
Hemoglobin: 13.4 g/dL (ref 12.0–15.0)
Lymphocytes Relative: 53 %
Lymphs Abs: 3.2 10*3/uL (ref 0.7–4.0)
MCH: 31.2 pg (ref 26.0–34.0)
MCHC: 34.1 g/dL (ref 30.0–36.0)
MCV: 91.6 fL (ref 78.0–100.0)
Monocytes Absolute: 0.6 10*3/uL (ref 0.1–1.0)
Monocytes Relative: 9 %
Neutro Abs: 2.1 10*3/uL (ref 1.7–7.7)
Neutrophils Relative %: 34 %
Platelets: 323 10*3/uL (ref 150–400)
RBC: 4.29 MIL/uL (ref 3.87–5.11)
RDW: 13.8 % (ref 11.5–15.5)
WBC: 6.1 10*3/uL (ref 4.0–10.5)

## 2018-08-02 MED ORDER — IOPAMIDOL (ISOVUE-370) INJECTION 76%
INTRAVENOUS | Status: AC
  Filled 2018-08-02: qty 100

## 2018-08-02 MED ORDER — IOPAMIDOL (ISOVUE-370) INJECTION 76%
100.0000 mL | Freq: Once | INTRAVENOUS | Status: AC | PRN
  Administered 2018-08-02: 100 mL via INTRAVENOUS

## 2018-08-02 NOTE — ED Provider Notes (Signed)
[facility] DEPT Provider Note   CSN: [id number] Arrival date & time: 08/02/18  0935     History   Chief Complaint Chief Complaint  Patient presents with  . Generalized Body Aches    HPI [name] is a 52 y.o. female with history of hyperlipidemia and atypical chest pain presents for evaluation of acute onset, resolved left-sided chest pains 6 days ago and 5 days of constant pins-and-needles sensation.  She states that on Saturday she experienced intermittent dull achy pain to the left lateral aspect of the chest which did not radiate and worsens with palpation.  It improved throughout the day and entirely resolved and she has not experienced it since then.  She notes no associated nausea, vomiting, diaphoresis, lightheadedness, shortness of breath, or syncope.  She states that on Sunday she began to develop a pins-and-needles sharp shocklike sensation to her body.  She states that it moves around frequently but is worse on the left compared to the right.  She also notes the sensation to the occipital region and the crown of the skull.  While en route to the hospital she noted development of sudden onset severe pain to the occipital region which resolved within 1 to 2 minutes and has not returned.  She smokes approximately 2 cigarettes daily, denies recreational drug use.  Her father has suffered a stroke later in life, no family history of stroke at a young age.  She has not tried any medications for her symptoms.  Denies vision changes, numbness, weakness, slurred speech, facial droop, or syncope.  The history is provided by the patient.    Past Medical History:  Diagnosis Date  . Chest pain 2008   neg work up- dx'd with stress  . Hyperlipidemia   . No pertinent past medical history     Patient Active Problem List   Diagnosis Date Noted  . HYPERLIPIDEMIA 01/05/2011  . EAR PAIN, LEFT 03/09/2009  . ELBOW PAIN 03/09/2009    Past Surgical History:    Procedure Laterality Date  . BREAST SURGERY    . CESAREAN SECTION     x 2  . HAMMER TOE SURGERY    . LAPAROSCOPIC ASSISTED VAGINAL HYSTERECTOMY  02/09/2012   Procedure: LAPAROSCOPIC ASSISTED VAGINAL HYSTERECTOMY;  Surgeon: Marvene Staff, MD;  Location: [facility];  Service: Gynecology;  Laterality: N/A;  Requests 2 hrs.  Also wants a 3 way foley.     OB History   None      Home Medications    Prior to Admission medications   Medication Sig Start Date End Date Taking? Authorizing Provider  atorvastatin (LIPITOR) 40 MG tablet Take 40 mg by mouth daily.   Yes [provider]  azelastine (ASTELIN) 0.1 % nasal spray Place 1 spray into both nostrils 2 (two) times daily as needed for rhinitis or allergies. Use in each nostril as directed   Yes [provider]  loratadine (CLARITIN) 10 MG tablet Take 10 mg by mouth daily.   Yes [provider]  montelukast (SINGULAIR) 10 MG tablet Take 10 mg by mouth at bedtime as needed (allergies).    Yes [provider]  Multiple Vitamin (MULTIVITAMIN WITH MINERALS) TABS tablet Take 1 tablet by mouth daily.   Yes [provider]  varenicline (CHANTIX) 1 MG tablet Take 1 mg by mouth 2 (two) times daily.   Yes [provider]  predniSONE (DELTASONE) 20 MG tablet Take 1 tablet (20 mg total) by mouth 2 (  two) times daily. Patient not taking: Reported on 08/02/2018 01/25/18   [name], MD    Family History No family history on file.  Social History Social History   Tobacco Use  . Smoking status: Current Every Day Smoker    Packs/day: 0.25    Years: 15.00    Pack years: 3.75    Types: Cigarettes  Substance Use Topics  . Alcohol use: No  . Drug use: No     Allergies   Aspirin and Penicillins   Review of Systems Review of Systems  Constitutional: Negative for chills and fever.  Eyes: Negative for visual disturbance.  Respiratory: Negative for shortness of breath.   Cardiovascular:  Positive for chest pain (resolved).  Gastrointestinal: Negative for abdominal pain, nausea and vomiting.  Musculoskeletal: Negative for gait problem.  Neurological: Positive for headaches. Negative for dizziness, syncope, facial asymmetry, speech difficulty, weakness, light-headedness and numbness.       +tingling  All other systems reviewed and are negative.    Physical Exam Updated Vital Signs BP 95/76 (BP Location: Left Arm)   Pulse 79   Temp 98.2 F (36.8 C) (Oral)   Resp 17   LMP 01/18/2012   SpO2 97%   Physical Exam  Constitutional: She is oriented to person, place, and time. She appears well-developed and well-nourished. No distress.  HENT:  Head: Normocephalic and atraumatic.  Right Ear: External ear normal.  Left Ear: External ear normal.  Eyes: Pupils are equal, round, and reactive to light. Conjunctivae and EOM are normal. Right eye exhibits no discharge. Left eye exhibits no discharge.  Neck: Normal range of motion. Neck supple. No JVD present. No tracheal deviation present.  Cardiovascular: Normal rate, regular rhythm, normal heart sounds and intact distal pulses.  Pulmonary/Chest: Effort normal and breath sounds normal. She exhibits no tenderness.  Abdominal: Soft. Bowel sounds are normal. She exhibits no distension. There is no tenderness.  Musculoskeletal: Normal range of motion. She exhibits no edema or tenderness.  Neurological: She is alert and oriented to person, place, and time. No cranial nerve deficit or sensory deficit. She exhibits normal muscle tone.  Mental Status:  Alert, thought content appropriate, able to give a coherent history. Speech fluent without evidence of aphasia. Able to follow 2 step commands without difficulty.  Cranial Nerves:  II:  Peripheral visual fields grossly normal, pupils equal, round, reactive to light III,IV, VI: ptosis not present, extra-ocular motions intact bilaterally  V,VII: smile symmetric, facial light touch sensation  equal VIII: hearing grossly normal to voice  X: uvula elevates symmetrically  XI: bilateral shoulder shrug symmetric and strong XII: midline tongue extension without fassiculations Motor:  Normal tone. 5/5 strength of BUE and BLE major muscle groups including strong and equal grip strength and dorsiflexion/plantar flexion Sensory: light touch normal in all extremities. DTRs: biceps and achilles 2+ symmetric b/l Cerebellar: normal finger-to-nose with bilateral upper extremities Gait: normal gait and balance. Able to walk on toes and heels with ease.  No pronator drift.  Romberg sign absent.  No nystagmus.  Skin: Skin is warm and dry. No erythema.  Psychiatric: She has a normal mood and affect. Her behavior is normal.  Nursing note and vitals reviewed.    ED Treatments / Results  Labs (all labs ordered are listed, but only abnormal results are displayed) Labs Reviewed  CBC WITH DIFFERENTIAL/PLATELET  BASIC METABOLIC PANEL    EKG None  Radiology Ct Angio Head W Or Wo Contrast  Result Date: 08/02/2018 CLINICAL DATA:  52 year old female with paresthesias and body pain for 4 days with generalized weakness. EXAM: CT ANGIOGRAPHY HEAD AND NECK TECHNIQUE: Multidetector CT imaging of the head and neck was performed using the standard protocol during bolus administration of intravenous contrast. Multiplanar CT image reconstructions and MIPs were obtained to evaluate the vascular anatomy. Carotid stenosis measurements (when applicable) are obtained utilizing NASCET criteria, using the distal internal carotid diameter as the denominator. CONTRAST:  148mL ISOVUE-370 IOPAMIDOL (ISOVUE-370) INJECTION 76% COMPARISON:  Head CT without contrast 1151 hours today. Head and cervical spine CT 07/20/2014. FINDINGS: CTA NECK Skeleton: No acute osseous abnormality identified. Upper chest: Negative visible upper lungs. No superior mediastinal lymphadenopathy. Visible central pulmonary arteries are patent. Other  neck: Small hypodense bilateral thyroid nodules, do not meet size criteria for ultrasound follow-up. Otherwise negative. Aortic arch: Mildly bovine type arch configuration with soft and calcified plaque limited to the left subclavian artery origin. Right carotid system: Negative aside from minimal soft and calcified plaque at the posterior right ICA origin. No stenosis. Left carotid system: Moderate soft plaque at the left ICA origin results in stenosis measuring 50-60 % with respect to the distal vessel (series 9, image 118). Mild superimposed calcified plaque. Otherwise negative left ICA to the skull base. Vertebral arteries: No proximal right subclavian artery stenosis. Minimal calcified plaque at the right vertebral artery origin with mild stenosis. Otherwise normal right vertebral artery to the skull base. Soft and calcified plaque in the proximal left subclavian artery without significant stenosis. Normal left vertebral artery origin. Normal left vertebral artery to the skull base. CTA HEAD Posterior circulation: Codominant distal vertebral arteries without stenosis. Normal left PICA origin. Dominant appearing right AICA is patent. Patent vertebrobasilar junction and basilar artery without stenosis. AICA, SCA, and PCA origins are patent. There is a fetal type right PCA origin. The left posterior communicating artery is diminutive or absent. Bilateral PCA branches are normal. Anterior circulation: Both ICA siphons are patent. The cavernous segments are tortuous. On the left there is generally mild calcified plaque. There is mild left cavernous ICA stenosis. On the right there is minimal calcified plaque but also mild proximal cavernous segment stenosis. Normal ophthalmic and right posterior communicating artery origins. Patent carotid termini. Normal MCA and left ACA origins. Dominant left A1 segment with diminutive or absent right A1. The anterior communicating artery and bilateral ACA branches are within  normal limits. Left MCA M1 segment, left MCA bifurcation, and left MCA branches are within normal limits. The right MCA M1 segment, bifurcation, and right MCA branches are within normal limits. Venous sinuses: Patent. Anatomic variants: Mildly bovine arch configuration. Fetal type right PCA origin. Delayed phase: No abnormal enhancement identified. Gray-white matter differentiation remains within normal limits. Review of the MIP images confirms the above findings IMPRESSION: 1. Negative for large vessel or circle-of-Willis branch occlusion. 2. Positive for predominantly soft atherosclerotic plaque at the left ICA origin resulting in up to 60% stenosis. 3. Mild atherosclerotic stenosis also at the bilateral cavernous ICA segments and right vertebral artery origin. 4. CT appearance of the brain is stable from earlier today and negative. Electronically Signed   By: Genevie Ann M.D.   On: 08/02/2018 15:17   Ct Head Wo Contrast  Result Date: 08/02/2018 CLINICAL DATA:  Diffuse tingling type sensation and pain EXAM: CT HEAD WITHOUT CONTRAST TECHNIQUE: Contiguous axial images were obtained from the base of the skull through the vertex without intravenous contrast. COMPARISON:  July 20, 2014 FINDINGS: Brain: The ventricles are normal in  size and configuration. There is no intracranial mass, hemorrhage, extra-axial fluid collection, or midline shift. Gray-white compartments appear normal. There is no acute infarct evident. Vascular: No hyperdense vessel. There is calcification in each carotid siphon region. Skull: The bony calvarium appears intact. There are benign enostoses in the right frontal region. Sinuses/Orbits: There is opacification of a posterior inferior ethmoid air cell on the right. Other visualized paranasal sinuses are clear. Visualized orbits appear symmetric. Other: Mastoid air cells are clear. IMPRESSION: Foci of arterial vascular calcification. Opacification in a posterior right ethmoid air cell. Study  otherwise unremarkable. Electronically Signed   By: Lowella Grip III M.D.   On: 08/02/2018 12:11   Ct Angio Neck W And/or Wo Contrast  Result Date: 08/02/2018 CLINICAL DATA:  52 year old female with paresthesias and body pain for 4 days with generalized weakness. EXAM: CT ANGIOGRAPHY HEAD AND NECK TECHNIQUE: Multidetector CT imaging of the head and neck was performed using the standard protocol during bolus administration of intravenous contrast. Multiplanar CT image reconstructions and MIPs were obtained to evaluate the vascular anatomy. Carotid stenosis measurements (when applicable) are obtained utilizing NASCET criteria, using the distal internal carotid diameter as the denominator. CONTRAST:  15mL ISOVUE-370 IOPAMIDOL (ISOVUE-370) INJECTION 76% COMPARISON:  Head CT without contrast 1151 hours today. Head and cervical spine CT 07/20/2014. FINDINGS: CTA NECK Skeleton: No acute osseous abnormality identified. Upper chest: Negative visible upper lungs. No superior mediastinal lymphadenopathy. Visible central pulmonary arteries are patent. Other neck: Small hypodense bilateral thyroid nodules, do not meet size criteria for ultrasound follow-up. Otherwise negative. Aortic arch: Mildly bovine type arch configuration with soft and calcified plaque limited to the left subclavian artery origin. Right carotid system: Negative aside from minimal soft and calcified plaque at the posterior right ICA origin. No stenosis. Left carotid system: Moderate soft plaque at the left ICA origin results in stenosis measuring 50-60 % with respect to the distal vessel (series 9, image 118). Mild superimposed calcified plaque. Otherwise negative left ICA to the skull base. Vertebral arteries: No proximal right subclavian artery stenosis. Minimal calcified plaque at the right vertebral artery origin with mild stenosis. Otherwise normal right vertebral artery to the skull base. Soft and calcified plaque in the proximal left  subclavian artery without significant stenosis. Normal left vertebral artery origin. Normal left vertebral artery to the skull base. CTA HEAD Posterior circulation: Codominant distal vertebral arteries without stenosis. Normal left PICA origin. Dominant appearing right AICA is patent. Patent vertebrobasilar junction and basilar artery without stenosis. AICA, SCA, and PCA origins are patent. There is a fetal type right PCA origin. The left posterior communicating artery is diminutive or absent. Bilateral PCA branches are normal. Anterior circulation: Both ICA siphons are patent. The cavernous segments are tortuous. On the left there is generally mild calcified plaque. There is mild left cavernous ICA stenosis. On the right there is minimal calcified plaque but also mild proximal cavernous segment stenosis. Normal ophthalmic and right posterior communicating artery origins. Patent carotid termini. Normal MCA and left ACA origins. Dominant left A1 segment with diminutive or absent right A1. The anterior communicating artery and bilateral ACA branches are within normal limits. Left MCA M1 segment, left MCA bifurcation, and left MCA branches are within normal limits. The right MCA M1 segment, bifurcation, and right MCA branches are within normal limits. Venous sinuses: Patent. Anatomic variants: Mildly bovine arch configuration. Fetal type right PCA origin. Delayed phase: No abnormal enhancement identified. Gray-white matter differentiation remains within normal limits. Review of the MIP  images confirms the above findings IMPRESSION: 1. Negative for large vessel or circle-of-Willis branch occlusion. 2. Positive for predominantly soft atherosclerotic plaque at the left ICA origin resulting in up to 60% stenosis. 3. Mild atherosclerotic stenosis also at the bilateral cavernous ICA segments and right vertebral artery origin. 4. CT appearance of the brain is stable from earlier today and negative. Electronically Signed   By:  Genevie Ann M.D.   On: 08/02/2018 15:17    Procedures Procedures (including critical care time)  Medications Ordered in ED Medications  iopamidol (ISOVUE-370) 76 % injection 100 mL (100 mLs Intravenous Contrast Given 08/02/18 1441)     Initial Impression / Assessment and Plan / ED Course  I have reviewed the triage vital signs and the nursing notes.  Pertinent labs & imaging results that were available during my care of the patient were reviewed by me and considered in my medical decision making (see chart for details).     Patient with complaint of pins and needle sensation all over her body for 5 days.  This was preceded by 1 day of left-sided chest wall pain which she can reproduce on palpation.  She is afebrile, vital signs are stable.  She is nontoxic in appearance.  She is resting comfortably on my assessment.  No chest pain on my assessment and she did not have any recurrence of chest pain since she experienced it 6 days ago.  Feel that one troponin is sufficient to rule out ACS/MI given she has not had any chest pain in 6 days.  She is low risk for cardiac disease and her HEART score is 3.  She has no focal neurologic deficits and her neuroimaging today is normal.  Will obtain CT to rule out acute intracranial abnormality.  Lab work reviewed by me shows no abnormalities and is entirely within normal limits.  CT head shows foci of arterial vascular calcification and opacification in a posterior right ethmoid air cells but is otherwise unremarkable with no evidence of CVA, ICH, SAH.  With complaint of acute onset of posterior headache while en route to the hospital we will obtain CTA head and neck to rule out SAH.  This should be sufficient given her headache occurred less than 6 hours prior to the study.  CTA shows no evidence of large vessel or circle of Willis branch occlusion but is positive for predominantly soft atherosclerotic plaque at the left ICA origin resulting in up to 60% stenosis.   Also has mild atherosclerotic stenosis at the bilateral cavernous ICA segments and right vertebral artery origin.  Otherwise stable.  No evidence of SAH, ICH, CVA, or other acute intracranial abnormality.  She states that she was put on gabapentin in the past for sciatica but has not been taking it.  She also states that she is not 100% compliant with taking her statin.  Encouraged patient compliance to her medications.  Recommend follow-up with PCP or neurology for reevaluation of symptoms and for monitoring the 60% stenosis in the left ICA.  Discussed strict ED return precautions. Pt verbalized understanding of and agreement with plan and is safe for discharge home at this time.  She has no complaints prior to discharge.  Discussed with Dr. Melina Copa who agrees with assessment and plan at this time.  Final Clinical Impressions(s) / ED Diagnoses   Final diagnoses:  Paresthesias  Atypical chest pain    ED Discharge Orders    None       Oluwademilade Mckiver A,  PA-C 08/02/18 1607    Hayden Rasmussen, MD 08/02/18 1925

## 2018-08-02 NOTE — ED Notes (Signed)
Charge RN at bedside attempting IV. 

## 2018-08-02 NOTE — Discharge Instructions (Signed)
Drink plenty water and get plenty rest.  Take your home medicines as prescribed.  Follow-up with your primary care physician for reevaluation of your symptoms and for further review of your imaging today.  Return to the emergency department immediately for any concerning signs or symptoms develop such as fevers, slurred speech, weakness, or passing out.

## 2018-08-02 NOTE — ED Notes (Signed)
Patient transported to CT 

## 2018-08-02 NOTE — ED Triage Notes (Signed)
Pt complains of pain all over body x 4 days, pt states pain feels like pins and needles. Pt states she initially only had pain in her left rib cage.

## 2019-03-20 DIAGNOSIS — F419 Anxiety disorder, unspecified: Secondary | ICD-10-CM | POA: Insufficient documentation

## 2019-07-03 ENCOUNTER — Other Ambulatory Visit: Payer: Self-pay

## 2019-07-03 ENCOUNTER — Encounter (HOSPITAL_COMMUNITY): Payer: Self-pay | Admitting: Emergency Medicine

## 2019-07-03 ENCOUNTER — Emergency Department (HOSPITAL_COMMUNITY)
Admission: EM | Admit: 2019-07-03 | Discharge: 2019-07-04 | Disposition: A | Discharge status: Home / Self Care | Payer: PRIVATE HEALTH INSURANCE | Attending: Emergency Medicine | Admitting: Emergency Medicine

## 2019-07-03 ENCOUNTER — Emergency Department (HOSPITAL_COMMUNITY): Payer: PRIVATE HEALTH INSURANCE

## 2019-07-03 DIAGNOSIS — N390 Urinary tract infection, site not specified: Secondary | ICD-10-CM | POA: Diagnosis not present

## 2019-07-03 DIAGNOSIS — R0789 Other chest pain: Secondary | ICD-10-CM | POA: Insufficient documentation

## 2019-07-03 DIAGNOSIS — R030 Elevated blood-pressure reading, without diagnosis of hypertension: Secondary | ICD-10-CM | POA: Insufficient documentation

## 2019-07-03 DIAGNOSIS — Z79899 Other long term (current) drug therapy: Secondary | ICD-10-CM | POA: Insufficient documentation

## 2019-07-03 DIAGNOSIS — R079 Chest pain, unspecified: Secondary | ICD-10-CM | POA: Diagnosis present

## 2019-07-03 DIAGNOSIS — F17210 Nicotine dependence, cigarettes, uncomplicated: Secondary | ICD-10-CM | POA: Insufficient documentation

## 2019-07-03 LAB — CBC
HCT: 42 % (ref 36.0–46.0)
Hemoglobin: 13.5 g/dL (ref 12.0–15.0)
MCH: 31.5 pg (ref 26.0–34.0)
MCHC: 32.1 g/dL (ref 30.0–36.0)
MCV: 97.9 fL (ref 80.0–100.0)
Platelets: 273 10*3/uL (ref 150–400)
RBC: 4.29 MIL/uL (ref 3.87–5.11)
RDW: 14.6 % (ref 11.5–15.5)
WBC: 11.2 10*3/uL — ABNORMAL HIGH (ref 4.0–10.5)
nRBC: 0 % (ref 0.0–0.2)

## 2019-07-03 NOTE — ED Triage Notes (Signed)
Patient c/o central chest pain radiating to the left with intermittent sharp pains, headache and hypertension x1 week. Denies HTN hx. States buried mother today and believes sx are related to stressors.

## 2019-07-03 NOTE — ED Provider Notes (Signed)
[facility] DEPT Provider Note: Mackenzie Spurling, MD, FACEP  CSN: [id number] MRN: [id number] ARRIVAL: 07/03/19 at 1924 ROOM: [place]  Chest Pain   HISTORY OF PRESENT ILLNESS  07/03/19 11:13 PM [name] is a 53 y.o. female whose mother died 2 weeks ago.  For the past 2 weeks she has noticed her blood pressure has been higher than usual.  It is been as high as 178/100 and no lower than 145/81 when she has taken it.  She has also had episodes of chest discomfort which are moderate in severity, sharp in nature and located under her left breast and sometimes in the lower substernal region.  These episodes last about 15 minutes.  Nothing seems to make them better or worse.  They are not accompanied by shortness of breath, anxiety (although she has noticed being anxious over the past week), diaphoresis or nausea.  She has had a headache but no focal neurologic deficits.   Past Medical History:  Diagnosis Date  . Chest pain 2008   neg work up- dx'd with stress  . Hyperlipidemia   . No pertinent past medical history     Past Surgical History:  Procedure Laterality Date  . BREAST SURGERY    . CESAREAN SECTION     x 2  . HAMMER TOE SURGERY    . LAPAROSCOPIC ASSISTED VAGINAL HYSTERECTOMY  02/09/2012   Procedure: LAPAROSCOPIC ASSISTED VAGINAL HYSTERECTOMY;  Surgeon: Marvene Staff, MD;  Location: [facility];  Service: Gynecology;  Laterality: N/A;  Requests 2 hrs.  Also wants a 3 way foley.    No family history on file.  Social History   Tobacco Use  . Smoking status: Current Every Day Smoker    Packs/day: 0.25    Years: 15.00    Pack years: 3.75    Types: Cigarettes  Substance Use Topics  . Alcohol use: No  . Drug use: No    Prior to Admission medications   Medication Sig Start Date End Date Taking? Authorizing Provider  atorvastatin (LIPITOR) 40 MG tablet Take 40 mg by mouth daily.   Yes [provider]  diazepam (VALIUM) 5 MG tablet Take  5 mg by mouth every 8 (eight) hours as needed for anxiety.  06/26/19  Yes [provider]  fexofenadine (ALLEGRA) 60 MG tablet Take 60 mg by mouth daily.   Yes [provider]  fluticasone (FLONASE) 50 MCG/ACT nasal spray Place 1 spray into both nostrils daily.   Yes [provider]  loratadine (CLARITIN) 10 MG tablet Take 10 mg by mouth daily.   Yes [provider]  montelukast (SINGULAIR) 10 MG tablet Take 10 mg by mouth at bedtime as needed (allergies).    Yes [provider]  Vitamin D, Ergocalciferol, (DRISDOL) 1.25 MG (50000 UT) CAPS capsule Take 50,000 Units by mouth every 7 (seven) days.   Yes [provider]  hydrochlorothiazide (HYDRODIURIL) 25 MG tablet Take 1 tablet (25 mg total) by mouth daily. 07/04/19   Tara Rud, MD  nitrofurantoin, macrocrystal-monohydrate, (MACROBID) 100 MG capsule Take 1 capsule (100 mg total) by mouth 2 (two) times daily. X 7 days 07/04/19   Earnesteen Birnie, MD    Allergies Aspirin and Penicillins   REVIEW OF SYSTEMS  Negative except as noted here or in the History of Present Illness.   PHYSICAL EXAMINATION  Initial Vital Signs Blood pressure (!) 166/86, pulse 68, temperature 98.7 F (37.1 C), temperature source Oral, resp. rate 15, height  5\' 6"  (1.676 m), weight 75.6 kg, last menstrual period 01/18/2012, SpO2 100 %.  Examination General: Well-developed, well-nourished female in no acute distress; appearance consistent with age of record HENT: normocephalic; atraumatic Eyes: Normal appearance Neck: supple Heart: regular rate and rhythm Lungs: clear to auscultation bilaterally Abdomen: soft; nondistended; nontender; bowel sounds present Extremities: No deformity; full range of motion; pulses normal Neurologic: Awake, alert and oriented; motor function intact in all extremities and symmetric; no facial droop Skin: Warm and dry Psychiatric: Normal mood and affect   RESULTS  Summary of this visit's  results, reviewed by myself:   EKG Interpretation  Date/Time:  Wednesday July 03 2019 23:17:23 EDT Ventricular Rate:  61 PR Interval:  134 QRS Duration: 94 QT Interval:  405 QTC Calculation: 408 R Axis:   44 Text Interpretation:  Sinus rhythm Normal ECG Confirmed by Ardys Hataway [phone]) on 07/03/2019 11:22:16 PM      Laboratory Studies: Results for orders placed or performed during the hospital encounter of 07/03/19 (from the past 24 hour(s))  Basic metabolic panel     Status: Abnormal   Collection Time: 07/03/19 11:34 PM  Result Value Ref Range   Sodium 142 135 - 145 mmol/L   Potassium 4.2 3.5 - 5.1 mmol/L   Chloride 105 98 - 111 mmol/L   CO2 25 22 - 32 mmol/L   Glucose, Bld 97 70 - 99 mg/dL   BUN 22 (H) 6 - 20 mg/dL   Creatinine, Ser 0.75 0.44 - 1.00 mg/dL   Calcium 9.8 8.9 - 10.3 mg/dL   GFR calc non Af Amer >60 >60 mL/min   GFR calc Af Amer >60 >60 mL/min   Anion gap 12 5 - 15  CBC     Status: Abnormal   Collection Time: 07/03/19 11:34 PM  Result Value Ref Range   WBC 11.2 (H) 4.0 - 10.5 K/uL   RBC 4.29 3.87 - 5.11 MIL/uL   Hemoglobin 13.5 12.0 - 15.0 g/dL   HCT 42.0 36.0 - 46.0 %   MCV 97.9 80.0 - 100.0 fL   MCH 31.5 26.0 - 34.0 pg   MCHC 32.1 30.0 - 36.0 g/dL   RDW 14.6 11.5 - 15.5 %   Platelets 273 150 - 400 K/uL   nRBC 0.0 0.0 - 0.2 %  Troponin I (High Sensitivity)     Status: None   Collection Time: 07/03/19 11:34 PM  Result Value Ref Range   Troponin I (High Sensitivity) 3 <18 ng/L  Urinalysis, Routine w reflex microscopic     Status: Abnormal   Collection Time: 07/03/19 11:34 PM  Result Value Ref Range   Color, Urine YELLOW YELLOW   APPearance HAZY (A) CLEAR   Specific Gravity, Urine 1.016 1.005 - 1.030   pH 6.0 5.0 - 8.0   Glucose, UA NEGATIVE NEGATIVE mg/dL   Hgb urine dipstick LARGE (A) NEGATIVE   Bilirubin Urine NEGATIVE NEGATIVE   Ketones, ur NEGATIVE NEGATIVE mg/dL   Protein, ur 30 (A) NEGATIVE mg/dL   Nitrite NEGATIVE NEGATIVE    Leukocytes,Ua LARGE (A) NEGATIVE   RBC / HPF >50 (H) 0 - 5 RBC/hpf   WBC, UA >50 (H) 0 - 5 WBC/hpf   Bacteria, UA MANY (A) NONE SEEN   Squamous Epithelial / LPF 0-5 0 - 5   Mucus PRESENT    Imaging Studies: Dg Chest 2 View  Result Date: 07/03/2019 CLINICAL DATA:  Central chest pain radiating to the left, headache and hypertension for 1 week. EXAM:  CHEST - 2 VIEW COMPARISON:  None. FINDINGS: The heart size and mediastinal contours are within normal limits. Both lungs are clear. The visualized skeletal structures are unremarkable. IMPRESSION: Normal examination. Electronically Signed   By: Claudie Revering M.D.   On: 07/03/2019 21:40    ED COURSE and MDM  Nursing notes and initial vitals signs, including pulse oximetry, reviewed.  Vitals:   07/03/19 2324 07/04/19 0000 07/04/19 0030 07/04/19 0100  BP: (!) 160/84 (!) 158/90 (!) 155/78 (!) 151/83  Pulse: (!) 58 68 (!) 58 (!) 57  Resp: 17  15 15   Temp:      TempSrc:      SpO2: 100% 100% 100% 100%  Weight:      Height:       1:12 AM Patient asymptomatic.  Blood pressures have been mildly elevated.  We will start her on hydrochlorothiazide and have her follow-up with her PCP.  Urinalysis consistent with a urinary tract infection although she is not symptomatic.  We will treat this as well.  PROCEDURES    ED DIAGNOSES     ICD-10-CM   1. Atypical chest pain  R07.89   2. Lower urinary tract infectious disease  N39.0   3. Elevated blood pressure reading without diagnosis of hypertension  R03.0        Shari Natt, MD 07/04/19 [phone]

## 2019-07-03 NOTE — ED Notes (Signed)
Pt is waiting for labs to be drawn, pt is hard stick and always has ultra sound to obtain labs

## 2019-07-04 LAB — URINALYSIS, ROUTINE W REFLEX MICROSCOPIC
Bilirubin Urine: NEGATIVE
Glucose, UA: NEGATIVE mg/dL
Ketones, ur: NEGATIVE mg/dL
Nitrite: NEGATIVE
Protein, ur: 30 mg/dL — AB
RBC / HPF: >50 RBC/hpf — ABNORMAL HIGH (ref 0–5)
Specific Gravity, Urine: 1.016 (ref 1.005–1.030)
WBC, UA: >50 WBC/hpf — ABNORMAL HIGH (ref 0–5)
pH: 6 (ref 5.0–8.0)

## 2019-07-04 LAB — BASIC METABOLIC PANEL
Anion gap: 12 (ref 5–15)
BUN: 22 mg/dL — ABNORMAL HIGH (ref 6–20)
CO2: 25 mmol/L (ref 22–32)
Calcium: 9.8 mg/dL (ref 8.9–10.3)
Chloride: 105 mmol/L (ref 98–111)
Creatinine, Ser: 0.75 mg/dL (ref 0.44–1.00)
GFR calc Af Amer: >60 mL/min (ref >60)
GFR calc non Af Amer: >60 mL/min (ref >60)
Glucose, Bld: 97 mg/dL (ref 70–99)
Potassium: 4.2 mmol/L (ref 3.5–5.1)
Sodium: 142 mmol/L (ref 135–145)

## 2019-07-04 LAB — TROPONIN I (HIGH SENSITIVITY): Troponin I (High Sensitivity): 3 ng/L (ref <18)

## 2019-07-04 MED ORDER — HYDROCHLOROTHIAZIDE 25 MG PO TABS: 25.0000 mg | ORAL_TABLET | Freq: Every day | ORAL | 0 refills | Status: AC

## 2019-07-04 MED ORDER — HYDROCHLOROTHIAZIDE 12.5 MG PO CAPS
25.0000 mg | ORAL_CAPSULE | Freq: Once | ORAL | Status: AC
  Administered 2019-07-04: 25 mg via ORAL
  Filled 2019-07-04: qty 2

## 2019-07-04 MED ORDER — NITROFURANTOIN MONOHYD MACRO 100 MG PO CAPS: 100.0000 mg | ORAL_CAPSULE | Freq: Two times a day (BID) | ORAL | 0 refills | Status: DC

## 2019-07-04 MED ORDER — NITROFURANTOIN MONOHYD MACRO 100 MG PO CAPS
100.0000 mg | ORAL_CAPSULE | Freq: Once | ORAL | Status: AC
  Administered 2019-07-04: 100 mg via ORAL
  Filled 2019-07-04: qty 1

## 2019-07-05 LAB — URINE CULTURE: Culture: >=100000 — AB

## 2019-07-06 ENCOUNTER — Telehealth: Payer: Self-pay | Admitting: Emergency Medicine

## 2019-07-06 NOTE — Progress Notes (Signed)
ED Antimicrobial Stewardship Positive Culture Follow Up   [name] is an 53 y.o. female who presented to [facility] on 07/03/2019 with a chief complaint of chest pain and hypertension.  Chief Complaint  Patient presents with  . Chest Pain    Recent Results (from the past 720 hour(s))  Urine culture     Status: Abnormal   Collection Time: 07/03/19 11:34 PM   Specimen: Urine, Random  Result Value Ref Range Status   Specimen Description   Final    URINE, RANDOM Performed at [facility] [address] [place], [place] [address]    Special Requests   Final    NONE Performed at [facility], [place] [address], [place], [place] [address]    Culture >=100,000 COLONIES/mL PROTEUS MIRABILIS (A)  Final   Report Status 07/05/2019 FINAL  Final   Organism ID, Bacteria PROTEUS MIRABILIS (A)  Final      Susceptibility   Proteus mirabilis - MIC*    AMPICILLIN <=2 SENSITIVE Sensitive     CEFAZOLIN <=4 SENSITIVE Sensitive     CEFTRIAXONE <=1 SENSITIVE Sensitive     CIPROFLOXACIN <=0.25 SENSITIVE Sensitive     GENTAMICIN <=1 SENSITIVE Sensitive     IMIPENEM >=16 RESISTANT Resistant     NITROFURANTOIN 128 RESISTANT Resistant     TRIMETH/SULFA <=20 SENSITIVE Sensitive     AMPICILLIN/SULBACTAM <=2 SENSITIVE Sensitive     PIP/TAZO <=4 SENSITIVE Sensitive     * >=100,000 COLONIES/mL PROTEUS MIRABILIS    [x]  Treated with nitrofurantoin, organism resistant to prescribed antimicrobial []  Patient discharged originally without antimicrobial agent and treatment is now indicated  Plan: 1) d/c nitrofurantoin 2) Ask patient if she has urinary symptoms 3) If has urinary symptoms, then keflex 500 mg PO QID x7 days   ED Provider: Eliezer Mccoy, PA-C   Dia Sitter P 07/06/2019, 11:39 AM Clinical Pharmacist [phone]

## 2019-07-06 NOTE — Telephone Encounter (Signed)
Post ED Visit - Positive Culture Follow-up: Successful Patient Follow-Up  Culture assessed and recommendations reviewed by:  []  Elenor Quinones, Pharm.D. []  Heide Guile, Pharm.D., BCPS AQ-ID []  Parks Neptune, Pharm.D., BCPS []  Alycia Rossetti, Pharm.D., BCPS []  [place], Pharm.D., BCPS, AAHIVP []  Legrand Como, Pharm.D., BCPS, AAHIVP []  Salome Arnt, PharmD, BCPS []  Johnnette Gourd, PharmD, BCPS []  Hughes Better, PharmD, BCPS [x]  Dia Sitter, PharmD  Positive urine culture  []  Patient discharged without antimicrobial prescription and treatment is now indicated [x]  Organism is resistant to prescribed ED discharge antimicrobial []  Patient with positive blood cultures  Changes discussed with ED provider: Eliezer Mccoy PA  New antibiotic prescription: Keflex 500 mg, four times daily x seven days Called to [facility] ([place]) [phone].  Contacted patient, date 07/06/2019, time [place] 07/06/2019, 3:20 PM

## 2020-05-26 ENCOUNTER — Emergency Department (HOSPITAL_COMMUNITY): Payer: Managed Care, Other (non HMO)

## 2020-05-26 ENCOUNTER — Other Ambulatory Visit: Payer: Self-pay

## 2020-05-26 ENCOUNTER — Encounter (HOSPITAL_COMMUNITY): Payer: Self-pay

## 2020-05-26 ENCOUNTER — Emergency Department (HOSPITAL_COMMUNITY)
Admission: EM | Admit: 2020-05-26 | Discharge: 2020-05-26 | Disposition: A | Discharge status: Home / Self Care | Payer: Managed Care, Other (non HMO) | Attending: Emergency Medicine | Admitting: Emergency Medicine

## 2020-05-26 DIAGNOSIS — F17210 Nicotine dependence, cigarettes, uncomplicated: Secondary | ICD-10-CM | POA: Diagnosis not present

## 2020-05-26 DIAGNOSIS — M25512 Pain in left shoulder: Secondary | ICD-10-CM | POA: Diagnosis present

## 2020-05-26 MED ORDER — OXYCODONE-ACETAMINOPHEN 5-325 MG PO TABS
1.0000 | ORAL_TABLET | Freq: Once | ORAL | Status: AC
  Administered 2020-05-26: 1 via ORAL
  Filled 2020-05-26: qty 1

## 2020-05-26 MED ORDER — METHOCARBAMOL 500 MG PO TABS
500.0000 mg | ORAL_TABLET | Freq: Once | ORAL | Status: AC
  Administered 2020-05-26: 500 mg via ORAL
  Filled 2020-05-26: qty 1

## 2020-05-26 MED ORDER — MELOXICAM 7.5 MG PO TABS: 7.5000 mg | ORAL_TABLET | Freq: Every day | ORAL | 0 refills | Status: DC

## 2020-05-26 MED ORDER — OXYCODONE-ACETAMINOPHEN 5-325 MG PO TABS: 1.0000 | ORAL_TABLET | Freq: Three times a day (TID) | ORAL | 0 refills | Status: DC | PRN

## 2020-05-26 MED ORDER — KETOROLAC TROMETHAMINE 60 MG/2ML IM SOLN
30.0000 mg | Freq: Once | INTRAMUSCULAR | Status: AC
  Administered 2020-05-26: 30 mg via INTRAMUSCULAR
  Filled 2020-05-26: qty 2

## 2020-05-26 MED ORDER — CYCLOBENZAPRINE HCL 10 MG PO TABS: 10.0000 mg | ORAL_TABLET | Freq: Two times a day (BID) | ORAL | 0 refills | Status: DC | PRN

## 2020-05-26 NOTE — ED Provider Notes (Signed)
[facility] DEPT Provider Note   CSN: [id number] Arrival date & time: 05/26/20  0423     History Chief Complaint  Patient presents with  . Shoulder Pain    [name] is a 54 y.o. female.   Shoulder Pain Location:  Shoulder Shoulder location:  L shoulder Injury: no   Pain details:    Quality:  Aching, shooting and sharp   Radiates to:  L upper arm   Severity:  Mild   Onset quality:  Gradual   Duration:  3 days   Timing:  Constant   Progression:  Worsening Handedness:  Right-handed Foreign body present:  No foreign bodies Prior injury to area:  No Relieved by:  None tried Worsened by:  Nothing Ineffective treatments:  None tried Associated symptoms: no back pain, no fever and no neck pain        Past Medical History:  Diagnosis Date  . Chest pain 2008   neg work up- dx'd with stress  . Hyperlipidemia   . No pertinent past medical history     Patient Active Problem List   Diagnosis Date Noted  . HYPERLIPIDEMIA 01/05/2011  . EAR PAIN, LEFT 03/09/2009  . ELBOW PAIN 03/09/2009    Past Surgical History:  Procedure Laterality Date  . BREAST SURGERY    . CESAREAN SECTION     x 2  . HAMMER TOE SURGERY    . LAPAROSCOPIC ASSISTED VAGINAL HYSTERECTOMY  02/09/2012   Procedure: LAPAROSCOPIC ASSISTED VAGINAL HYSTERECTOMY;  Surgeon: Marvene Staff, MD;  Location: [facility];  Service: Gynecology;  Laterality: N/A;  Requests 2 hrs.  Also wants a 3 way foley.     OB History   No obstetric history on file.     No family history on file.  Social History   Tobacco Use  . Smoking status: Current Every Day Smoker    Packs/day: 0.25    Years: 15.00    Pack years: 3.75    Types: Cigarettes  . Smokeless tobacco: Never Used  Substance Use Topics  . Alcohol use: No  . Drug use: No    Home Medications Prior to Admission medications   Medication Sig Start Date End Date Taking? Authorizing Provider  atorvastatin (LIPITOR) 40  MG tablet Take 40 mg by mouth daily.    [provider]  cyclobenzaprine (FLEXERIL) 10 MG tablet Take 1 tablet (10 mg total) by mouth 2 (two) times daily as needed for muscle spasms. 05/26/20   Johnasia Liese, Corene Cornea, MD  diazepam (VALIUM) 5 MG tablet Take 5 mg by mouth every 8 (eight) hours as needed for anxiety.  06/26/19   [provider]  fexofenadine (ALLEGRA) 60 MG tablet Take 60 mg by mouth daily.    [provider]  fluticasone (FLONASE) 50 MCG/ACT nasal spray Place 1 spray into both nostrils daily.    [provider]  hydrochlorothiazide (HYDRODIURIL) 25 MG tablet Take 1 tablet (25 mg total) by mouth daily. 07/04/19   Molpus, John, MD  loratadine (CLARITIN) 10 MG tablet Take 10 mg by mouth daily.    [provider]  meloxicam (MOBIC) 7.5 MG tablet Take 1 tablet (7.5 mg total) by mouth daily. 05/26/20   Elida Harbin, Corene Cornea, MD  montelukast (SINGULAIR) 10 MG tablet Take 10 mg by mouth at bedtime as needed (allergies).     [provider]  nitrofurantoin, macrocrystal-monohydrate, (MACROBID) 100 MG capsule Take 1 capsule (100 mg total) by mouth 2 (two) times daily. X 7  days 07/04/19   Molpus, John, MD  oxyCODONE-acetaminophen (PERCOCET) 5-325 MG tablet Take 1 tablet by mouth every 8 (eight) hours as needed for severe pain. 05/26/20   Ayeisha Lindenberger, Corene Cornea, MD  Vitamin D, Ergocalciferol, (DRISDOL) 1.25 MG (50000 UT) CAPS capsule Take 50,000 Units by mouth every 7 (seven) days.    [provider]    Allergies    Aspirin and Penicillins  Review of Systems   Review of Systems  Constitutional: Negative for activity change, appetite change, chills and fever.  Musculoskeletal: Positive for myalgias. Negative for back pain, neck pain and neck stiffness.  All other systems reviewed and are negative.   Physical Exam Updated Vital Signs BP (!) 164/103 (BP Location: Right Arm)   Pulse 78   Temp 98.4 F (36.9 C) (Oral)   Resp 18   Ht 5\' 6"  (1.676 m)   Wt 78 kg    LMP 01/18/2012   SpO2 99%   BMI 27.76 kg/m   Physical Exam Vitals and nursing note reviewed.  Constitutional:      Appearance: She is well-developed.  HENT:     Head: Normocephalic and atraumatic.     Mouth/Throat:     Mouth: Mucous membranes are moist.     Pharynx: Oropharynx is clear.  Eyes:     Conjunctiva/sclera: Conjunctivae normal.     Pupils: Pupils are equal, round, and reactive to light.  Cardiovascular:     Rate and Rhythm: Normal rate and regular rhythm.  Pulmonary:     Effort: No respiratory distress.     Breath sounds: No stridor.  Abdominal:     General: Abdomen is flat. There is no distension.  Musculoskeletal:        General: Tenderness (over anterior shoulder on left. worse with ROM and palpation. ) present.     Cervical back: Normal range of motion.  Skin:    General: Skin is warm and dry.  Neurological:     General: No focal deficit present.     Mental Status: She is alert.     ED Results / Procedures / Treatments   Labs (all labs ordered are listed, but only abnormal results are displayed) Labs Reviewed - No data to display  EKG None  Radiology DG Shoulder Left  Result Date: 05/26/2020 CLINICAL DATA:  Shoulder pain for 3 days. EXAM: LEFT SHOULDER - 2+ VIEW COMPARISON:  None. FINDINGS: There is no evidence of fracture or dislocation. There is no evidence of arthropathy or other focal bone abnormality. Soft tissues are unremarkable. IMPRESSION: Negative. Electronically Signed   By: Monte Fantasia M.D.   On: 05/26/2020 05:25    Procedures Procedures (including critical care time)  Medications Ordered in ED Medications  oxyCODONE-acetaminophen (PERCOCET/ROXICET) 5-325 MG per tablet 1 tablet (has no administration in time range)  ketorolac (TORADOL) injection 30 mg (has no administration in time range)  methocarbamol (ROBAXIN) tablet 500 mg (has no administration in time range)    ED Course  I have reviewed the triage vital signs and the  nursing notes.  Pertinent labs & imaging results that were available during my care of the patient were reviewed by me and considered in my medical decision making (see chart for details).    MDM Rules/Calculators/A&P                          Rotator cuff?  Bicep tendinitis? No e/o septic or gouty arthritis.  No obvious lesions on xr.  Will dc with supportive care, sling and ortho follow up if not improving.   Final Clinical Impression(s) / ED Diagnoses Final diagnoses:  Acute pain of left shoulder    Rx / DC Orders ED Discharge Orders         Ordered    oxyCODONE-acetaminophen (PERCOCET) 5-325 MG tablet  Every 8 hours PRN     Discontinue  Reprint     05/26/20 0605    cyclobenzaprine (FLEXERIL) 10 MG tablet  2 times daily PRN     Discontinue  Reprint     05/26/20 0605    meloxicam (MOBIC) 7.5 MG tablet  Daily     Discontinue  Reprint     05/26/20 0605           Tiaria Biby, Corene Cornea, MD 05/26/20 [id number]

## 2020-05-26 NOTE — ED Triage Notes (Signed)
Nontraumatic left shoulder pain with limited ROM.

## 2020-07-21 ENCOUNTER — Emergency Department (HOSPITAL_BASED_OUTPATIENT_CLINIC_OR_DEPARTMENT_OTHER): Payer: Managed Care, Other (non HMO)

## 2020-07-21 ENCOUNTER — Encounter (HOSPITAL_BASED_OUTPATIENT_CLINIC_OR_DEPARTMENT_OTHER): Payer: Self-pay | Admitting: *Deleted

## 2020-07-21 ENCOUNTER — Emergency Department (HOSPITAL_BASED_OUTPATIENT_CLINIC_OR_DEPARTMENT_OTHER)
Admission: EM | Admit: 2020-07-21 | Discharge: 2020-07-21 | Disposition: A | Discharge status: Home / Self Care | Payer: Managed Care, Other (non HMO) | Attending: Emergency Medicine | Admitting: Emergency Medicine

## 2020-07-21 ENCOUNTER — Other Ambulatory Visit: Payer: Self-pay

## 2020-07-21 DIAGNOSIS — M25561 Pain in right knee: Secondary | ICD-10-CM | POA: Diagnosis not present

## 2020-07-21 DIAGNOSIS — Y9389 Activity, other specified: Secondary | ICD-10-CM | POA: Diagnosis not present

## 2020-07-21 DIAGNOSIS — Y998 Other external cause status: Secondary | ICD-10-CM | POA: Diagnosis not present

## 2020-07-21 DIAGNOSIS — Y9289 Other specified places as the place of occurrence of the external cause: Secondary | ICD-10-CM | POA: Diagnosis not present

## 2020-07-21 DIAGNOSIS — W010XXA Fall on same level from slipping, tripping and stumbling without subsequent striking against object, initial encounter: Secondary | ICD-10-CM | POA: Insufficient documentation

## 2020-07-21 DIAGNOSIS — F17210 Nicotine dependence, cigarettes, uncomplicated: Secondary | ICD-10-CM | POA: Insufficient documentation

## 2020-07-21 DIAGNOSIS — W19XXXA Unspecified fall, initial encounter: Secondary | ICD-10-CM

## 2020-07-21 MED ORDER — ONDANSETRON HCL 4 MG PO TABS
4.0000 mg | ORAL_TABLET | Freq: Once | ORAL | Status: DC
  Filled 2020-07-21: qty 1

## 2020-07-21 MED ORDER — ONDANSETRON 4 MG PO TBDP
ORAL_TABLET | ORAL | Status: AC
  Administered 2020-07-21: 4 mg
  Filled 2020-07-21: qty 1

## 2020-07-21 MED ORDER — HYDROCODONE-ACETAMINOPHEN 5-325 MG PO TABS
1.0000 | ORAL_TABLET | Freq: Once | ORAL | Status: AC
  Administered 2020-07-21: 1 via ORAL
  Filled 2020-07-21: qty 1

## 2020-07-21 MED ORDER — LIDOCAINE 5 % EX PTCH
1.0000 | MEDICATED_PATCH | Freq: Once | CUTANEOUS | Status: DC
  Administered 2020-07-21: 1 via TRANSDERMAL
  Filled 2020-07-21: qty 1

## 2020-07-21 MED ORDER — ONDANSETRON 4 MG PO TBDP: 4.0000 mg | ORAL_TABLET | Freq: Once | ORAL | Status: AC

## 2020-07-21 NOTE — ED Provider Notes (Signed)
[facility] EMERGENCY DEPARTMENT Provider Note   CSN: [id number] Arrival date & time: 07/21/20  1427     History Chief Complaint  Patient presents with  . Knee Injury    [name] is a 54 y.o. female with past medical history significant for hyperlipidemia.  Patient is not on anticoagulation.  HPI Patient presents today to emergency department with chief complaint of right knee pain x2 days.  Patient states last night she was at work and slipped on a wet floor.  She was able to catch herself by grabbing onto the nearby register.  She did not fall to the ground however did hit her right knee on the wall.  She heard a pop.  She is endorsing constant aching pain in her right knee, right ankle and left hip.  She rates the pain 7 out of 10 in severity.  She did not take anything for pain prior to arrival.  She denies hitting her head or loss of consciousness.  She has been able to walk to the near fall however has pain with ambulation.  She denies any fever, chills, numbness, weakness, back pain, decreased sensation in lower extremities, bowel or bladder incontinence.     Past Medical History:  Diagnosis Date  . Chest pain 2008   neg work up- dx'd with stress  . Hyperlipidemia   . No pertinent past medical history     Patient Active Problem List   Diagnosis Date Noted  . HYPERLIPIDEMIA 01/05/2011  . EAR PAIN, LEFT 03/09/2009  . ELBOW PAIN 03/09/2009    Past Surgical History:  Procedure Laterality Date  . BREAST SURGERY    . CESAREAN SECTION     x 2  . HAMMER TOE SURGERY    . LAPAROSCOPIC ASSISTED VAGINAL HYSTERECTOMY  02/09/2012   Procedure: LAPAROSCOPIC ASSISTED VAGINAL HYSTERECTOMY;  Surgeon: Marvene Staff, MD;  Location: [facility];  Service: Gynecology;  Laterality: N/A;  Requests 2 hrs.  Also wants a 3 way foley.     OB History   No obstetric history on file.     No family history on file.  Social History   Tobacco Use  . Smoking status: Current  Every Day Smoker    Packs/day: 0.25    Years: 15.00    Pack years: 3.75    Types: Cigarettes  . Smokeless tobacco: Never Used  Substance Use Topics  . Alcohol use: No  . Drug use: No    Home Medications Prior to Admission medications   Medication Sig Start Date End Date Taking? Authorizing Provider  atorvastatin (LIPITOR) 40 MG tablet Take 40 mg by mouth daily.    [provider]  cyclobenzaprine (FLEXERIL) 10 MG tablet Take 1 tablet (10 mg total) by mouth 2 (two) times daily as needed for muscle spasms. 05/26/20   Mesner, Corene Cornea, MD  diazepam (VALIUM) 5 MG tablet Take 5 mg by mouth every 8 (eight) hours as needed for anxiety.  06/26/19   [provider]  fexofenadine (ALLEGRA) 60 MG tablet Take 60 mg by mouth daily.    [provider]  fluticasone (FLONASE) 50 MCG/ACT nasal spray Place 1 spray into both nostrils daily.    [provider]  hydrochlorothiazide (HYDRODIURIL) 25 MG tablet Take 1 tablet (25 mg total) by mouth daily. 07/04/19   Molpus, John, MD  loratadine (CLARITIN) 10 MG tablet Take 10 mg by mouth daily.    [provider]  meloxicam (MOBIC) 7.5 MG tablet Take 1  tablet (7.5 mg total) by mouth daily. 05/26/20   Mesner, Corene Cornea, MD  montelukast (SINGULAIR) 10 MG tablet Take 10 mg by mouth at bedtime as needed (allergies).     [provider]  nitrofurantoin, macrocrystal-monohydrate, (MACROBID) 100 MG capsule Take 1 capsule (100 mg total) by mouth 2 (two) times daily. X 7 days 07/04/19   Molpus, John, MD  oxyCODONE-acetaminophen (PERCOCET) 5-325 MG tablet Take 1 tablet by mouth every 8 (eight) hours as needed for severe pain. 05/26/20   Mesner, Corene Cornea, MD  Vitamin D, Ergocalciferol, (DRISDOL) 1.25 MG (50000 UT) CAPS capsule Take 50,000 Units by mouth every 7 (seven) days.    [provider]    Allergies    Aspirin and Penicillins  Review of Systems   Review of Systems  All other systems are reviewed and are negative for  acute change except as noted in the HPI.   Physical Exam Updated Vital Signs BP 139/76 (BP Location: Right Arm)   Pulse (!) 56   Temp 97.9 F (36.6 C) (Oral)   Resp 18   Ht 5\' 6"  (1.676 m)   Wt 81.8 kg   LMP 01/18/2012   SpO2 100%   BMI 29.10 kg/m   Physical Exam Vitals and nursing note reviewed.  Constitutional:      Appearance: She is well-developed. She is not ill-appearing or toxic-appearing.  HENT:     Head: Normocephalic and atraumatic.     Nose: Nose normal.  Eyes:     General: No scleral icterus.       Right eye: No discharge.        Left eye: No discharge.     Conjunctiva/sclera: Conjunctivae normal.  Neck:     Vascular: No JVD.  Cardiovascular:     Rate and Rhythm: Normal rate and regular rhythm.     Pulses: Normal pulses.          Dorsalis pedis pulses are 2+ on the right side and 2+ on the left side.     Heart sounds: Normal heart sounds.  Pulmonary:     Effort: Pulmonary effort is normal.     Breath sounds: Normal breath sounds.  Abdominal:     General: There is no distension.  Musculoskeletal:     Cervical back: Normal range of motion.     Comments: Bony tenderness to palpation of left hip. Pelvis is stable.   Decreased ROM of right knee secondary to pain. Full passive ROM. No crepitus or deformity.  There is swelling and tenderness over the lateral malleolus. No overt deformity. No tenderness over the medial aspect of the ankle. The fifth metatarsal is not tender. The ankle joint is intact without excessive opening on stressing. No break in skin. Good pedal pulse and cap refill of all toes. Wiggling toes without difficulty.  Compartments in right lower extremity are soft. Neurovascularly intact.  Skin:    General: Skin is warm and dry.  Neurological:     Mental Status: She is oriented to person, place, and time.     GCS: GCS eye subscore is 4. GCS verbal subscore is 5. GCS motor subscore is 6.     Comments: Fluent speech, no facial  droop.   Strength 5/5 with flexion and extension at the bilateral hips, knees, and ankles.    Psychiatric:        Behavior: Behavior normal.     ED Results / Procedures / Treatments   Labs (all labs ordered are listed, but only  abnormal results are displayed) Labs Reviewed - No data to display  EKG None  Radiology DG Tibia/Fibula Right  Result Date: 07/21/2020 CLINICAL DATA:  Fall with pain EXAM: RIGHT TIBIA AND FIBULA - 2 VIEW COMPARISON:  None. FINDINGS: No fracture or malalignment. Ossicle or old injury adjacent to the fibular malleolus. Soft tissues are unremarkable. IMPRESSION: No acute osseous abnormality. Electronically Signed   By: Donavan Foil M.D.   On: 07/21/2020 17:22   DG Ankle Complete Right  Result Date: 07/21/2020 CLINICAL DATA:  Fall with ankle pain EXAM: RIGHT ANKLE - COMPLETE 3+ VIEW COMPARISON:  None. FINDINGS: No acute displaced fracture or malalignment. Ossicle or old avulsion injury at the fibular malleolus. The ankle mortise is symmetric. IMPRESSION: No acute osseous abnormality. Ossicle or old avulsion injury at the fibular malleolus Electronically Signed   By: Donavan Foil M.D.   On: 07/21/2020 17:22   DG Knee Complete 4 Views Right  Result Date: 07/21/2020 CLINICAL DATA:  Twisting injury right knee when the patient slipped on a wet floor last night. Initial encounter. EXAM: RIGHT KNEE - COMPLETE 4+ VIEW COMPARISON:  None. FINDINGS: No evidence of fracture, dislocation, or joint effusion. No evidence of arthropathy or other focal bone abnormality. Soft tissues are unremarkable. IMPRESSION: Normal exam. Electronically Signed   By: Inge Rise M.D.   On: 07/21/2020 15:54   DG Foot Complete Right  Result Date: 07/21/2020 CLINICAL DATA:  Fall with foot pain EXAM: RIGHT FOOT COMPLETE - 3+ VIEW COMPARISON:  None. FINDINGS: No acute displaced fracture or malalignment. There is ankylosis across the second third and fourth PIP joints. The soft tissues are  unremarkable. IMPRESSION: No acute osseous abnormality. Electronically Signed   By: Donavan Foil M.D.   On: 07/21/2020 17:23   DG Hip Unilat W or Wo Pelvis 1 View Left  Result Date: 07/21/2020 CLINICAL DATA:  Twisting injury left hip last night when the patient slipped on a wet floor. Initial encounter. EXAM: DG HIP (WITH OR WITHOUT PELVIS) 1V*L* COMPARISON:  None. FINDINGS: There is no evidence of hip fracture or dislocation. There is no evidence of arthropathy or other focal bone abnormality. IMPRESSION: Normal exam. Electronically Signed   By: Inge Rise M.D.   On: 07/21/2020 15:55    Procedures Procedures (including critical care time)  Medications Ordered in ED Medications  lidocaine (LIDODERM) 5 % 1 patch (1 patch Transdermal Patch Applied 07/21/20 1716)  HYDROcodone-acetaminophen (NORCO/VICODIN) 5-325 MG per tablet 1 tablet (1 tablet Oral Given 07/21/20 1713)  ondansetron (ZOFRAN-ODT) disintegrating tablet 4 mg (4 mg Oral Given 07/21/20 1713)    ED Course  I have reviewed the triage vital signs and the nursing notes.  Pertinent labs & imaging results that were available during my care of the patient were reviewed by me and considered in my medical decision making (see chart for details).    MDM Rules/Calculators/A&P                          History provided by patient with additional history obtained from chart review.    Patient presents to the ED with complaints of pain to the left hip and right lower extremity s/p injury mechanical fall last night. Exam without obvious deformity or open wounds. ROM intact. Tender to palpation of distal aspect of right  tib fib, lateral malleolus of right ankle and left hip. NVI distally. I viewed all images which are negative for fracture/dislocation. PRICE and tylenol  recommended. Ambulates without difficulty. I discussed results, treatment plan, need for follow-up, and return precautions with the patient. Provided opportunity for  questions, patient confirmed understanding and are in agreement with plan.    Portions of this note were generated with [employer]. Dictation errors may occur despite best attempts at proofreading.   Final Clinical Impression(s) / ED Diagnoses Final diagnoses:  Acute pain of right knee  Fall, initial encounter    Rx / DC Orders ED Discharge Orders    None       Flint Melter 07/21/20 1813    Charlesetta Shanks, MD 08/01/20 1143

## 2020-07-21 NOTE — ED Notes (Signed)
All LDA's removed are not present on assessment.

## 2020-07-21 NOTE — ED Notes (Signed)
Pt discharged to home. Discharge instructions have been discussed with patient and/or family members. Pt verbally acknowledges understanding d/c instructions, and endorses comprehension to checkout at registration before leaving.  °

## 2020-07-21 NOTE — ED Triage Notes (Signed)
Last night at work she slipped on a wet floor. Injury to her right knee. Pain in her knee and left hip.

## 2020-07-21 NOTE — Discharge Instructions (Signed)
Recommend you take Tylenol for the next several days to help with your pain.  You can use a heating pad or ice as we discussed as well.  It is important that you do not sit in the same position for too long.  We do not want you to get stiff.   All the x-rays today were negative for any breaks or dislocations.  It is likely had a pulled muscle or have some bruising which will get better over time, should be feeling better within 7 to 10 days if you continue symptomatic care at home.

## 2020-08-02 ENCOUNTER — Emergency Department (HOSPITAL_BASED_OUTPATIENT_CLINIC_OR_DEPARTMENT_OTHER)
Admission: EM | Admit: 2020-08-02 | Discharge: 2020-08-02 | Disposition: A | Discharge status: Home / Self Care | Payer: Managed Care, Other (non HMO) | Attending: Emergency Medicine | Admitting: Emergency Medicine

## 2020-08-02 ENCOUNTER — Encounter (HOSPITAL_BASED_OUTPATIENT_CLINIC_OR_DEPARTMENT_OTHER): Payer: Self-pay | Admitting: Emergency Medicine

## 2020-08-02 ENCOUNTER — Emergency Department (HOSPITAL_BASED_OUTPATIENT_CLINIC_OR_DEPARTMENT_OTHER): Payer: Managed Care, Other (non HMO)

## 2020-08-02 ENCOUNTER — Other Ambulatory Visit: Payer: Self-pay

## 2020-08-02 DIAGNOSIS — F17210 Nicotine dependence, cigarettes, uncomplicated: Secondary | ICD-10-CM | POA: Diagnosis not present

## 2020-08-02 DIAGNOSIS — N644 Mastodynia: Secondary | ICD-10-CM | POA: Diagnosis not present

## 2020-08-02 DIAGNOSIS — N63 Unspecified lump in unspecified breast: Secondary | ICD-10-CM | POA: Diagnosis not present

## 2020-08-02 DIAGNOSIS — Z79899 Other long term (current) drug therapy: Secondary | ICD-10-CM | POA: Insufficient documentation

## 2020-08-02 DIAGNOSIS — R52 Pain, unspecified: Secondary | ICD-10-CM

## 2020-08-02 NOTE — ED Triage Notes (Signed)
Pt c/o right breast pain with lump lateral to areola. Pt denies injury, denies CP, denies shob

## 2020-08-02 NOTE — ED Provider Notes (Signed)
[facility] EMERGENCY DEPARTMENT Provider Note   CSN: [id number] Arrival date & time: 08/02/20  1407     History Chief Complaint  Patient presents with  . Breast Pain   [name] is a 54 y.o. female with pertinent medical history of bilateral breast reduction surgery presents for evaluation of right breast lump noticed today when she was laying in bed. No skin redness, warmth. No nipple discharge or bleeding. No fever.  Breast reduction surgery was almost 20 years ago.  Reports having bilateral breast pain for many years.  Pain is diffusely on both breasts. She had a mammogram earlier this year and normal.  Denies history of breast cancer or cysts.  Denies family history of breast or ovarian cancer. She does self breast exams weekly and did not notice this last week. H/o partial hysterectomy, still has ovaries.   HPI     Past Medical History:  Diagnosis Date  . Chest pain 2008   neg work up- dx'd with stress  . Hyperlipidemia   . No pertinent past medical history     Patient Active Problem List   Diagnosis Date Noted  . HYPERLIPIDEMIA 01/05/2011  . EAR PAIN, LEFT 03/09/2009  . ELBOW PAIN 03/09/2009    Past Surgical History:  Procedure Laterality Date  . BREAST SURGERY    . CESAREAN SECTION     x 2  . HAMMER TOE SURGERY    . LAPAROSCOPIC ASSISTED VAGINAL HYSTERECTOMY  02/09/2012   Procedure: LAPAROSCOPIC ASSISTED VAGINAL HYSTERECTOMY;  Surgeon: Marvene Staff, MD;  Location: [facility];  Service: Gynecology;  Laterality: N/A;  Requests 2 hrs.  Also wants a 3 way foley.     OB History   No obstetric history on file.     History reviewed. No pertinent family history.  Social History   Tobacco Use  . Smoking status: Current Every Day Smoker    Packs/day: 0.25    Years: 15.00    Pack years: 3.75    Types: Cigarettes  . Smokeless tobacco: Never Used  Substance Use Topics  . Alcohol use: No  . Drug use: No    Home Medications Prior to  Admission medications   Medication Sig Start Date End Date Taking? Authorizing Provider  atorvastatin (LIPITOR) 40 MG tablet Take 40 mg by mouth daily.    [provider]  cyclobenzaprine (FLEXERIL) 10 MG tablet Take 1 tablet (10 mg total) by mouth 2 (two) times daily as needed for muscle spasms. 05/26/20   Mesner, Corene Cornea, MD  diazepam (VALIUM) 5 MG tablet Take 5 mg by mouth every 8 (eight) hours as needed for anxiety.  06/26/19   [provider]  fexofenadine (ALLEGRA) 60 MG tablet Take 60 mg by mouth daily.    [provider]  fluticasone (FLONASE) 50 MCG/ACT nasal spray Place 1 spray into both nostrils daily.    [provider]  hydrochlorothiazide (HYDRODIURIL) 25 MG tablet Take 1 tablet (25 mg total) by mouth daily. 07/04/19   Molpus, John, MD  loratadine (CLARITIN) 10 MG tablet Take 10 mg by mouth daily.    [provider]  meloxicam (MOBIC) 7.5 MG tablet Take 1 tablet (7.5 mg total) by mouth daily. 05/26/20   Mesner, Corene Cornea, MD  montelukast (SINGULAIR) 10 MG tablet Take 10 mg by mouth at bedtime as needed (allergies).     [provider]  nitrofurantoin, macrocrystal-monohydrate, (MACROBID) 100 MG capsule Take 1 capsule (100 mg total) by mouth 2 (two) times daily.  X 7 days 07/04/19   Molpus, John, MD  oxyCODONE-acetaminophen (PERCOCET) 5-325 MG tablet Take 1 tablet by mouth every 8 (eight) hours as needed for severe pain. 05/26/20   Mesner, Corene Cornea, MD  Vitamin D, Ergocalciferol, (DRISDOL) 1.25 MG (50000 UT) CAPS capsule Take 50,000 Units by mouth every 7 (seven) days.    [provider]    Allergies    Aspirin and Penicillins  Review of Systems   Review of Systems  Skin:       Breast lump, tender   All other systems reviewed and are negative.   Physical Exam Updated Vital Signs BP (!) 145/77 (BP Location: Left Arm)   Pulse 72   Temp 98.9 F (37.2 C) (Oral)   Resp 18   Ht 5\' 6"  (1.676 m)   Wt 79.4 kg   LMP 01/18/2012   SpO2  100%   BMI 28.25 kg/m   Physical Exam Constitutional:      Appearance: She is well-developed.  HENT:     Head: Normocephalic.     Nose: Nose normal.  Eyes:     General: Lids are normal.  Cardiovascular:     Rate and Rhythm: Normal rate.  Pulmonary:     Effort: Pulmonary effort is normal. No respiratory distress.  Musculoskeletal:        General: Normal range of motion.     Cervical back: Normal range of motion.  Skin:    Comments: Bilateral breasts were examined.   Right breast: Skin is normal, no erythema, warmth.  Surgical scar around areola and inferior midline noted.  Nipple is flat.  Localized, firm elongated nodule palpated at around 9:00, mildly tender.  No fluctuance.  Diffuse fibrinous tissue noted throughout the breast.  No axillary lymphadenopathy.  Left breast: Skin is normal, no erythema, warmth.  Surgical scar around areola and inferior midline noted.  Nipple is flat. Diffuse fibrinous tissue noted throughout the breast.  No axillary lymphadenopathy.  Neurological:     Mental Status: She is alert.  Psychiatric:        Behavior: Behavior normal.     ED Results / Procedures / Treatments   Labs (all labs ordered are listed, but only abnormal results are displayed) Labs Reviewed - No data to display  EKG None  Radiology [place] Unlisted Procedure Breast  Result Date: 08/02/2020 CLINICAL DATA:  Painful lump within the outer RIGHT breast. Study performed to rule out abscess. EXAM: ULTRASOUND OF THE RIGHT BREAST COMPARISON:  None FINDINGS: Ultrasound is performed, evaluating the outer RIGHT breast, 8-11 o'clock axes, as directed by the patient, showing no abscess within the palpable area of concern. IMPRESSION: No breast abscess. RECOMMENDATION: 1. Given the description of a palpable lump, recommend referral to a dedicated breast center for further evaluation with diagnostic mammogram and ultrasound. 2. If mastitis is suspected based on clinical features, it is recommended  that patient be started on an appropriate course of antibiotics and referred to a Breast Surgeon or a dedicated [facility] in 3-5 days for re-evaluation. I have discussed the findings and recommendations with the patient. If applicable, a reminder letter will be sent to the patient regarding the next appointment. BI-RADS CATEGORY  0: Incomplete. Need additional imaging evaluation at a dedicated breast center. Electronically Signed   By: Franki Cabot M.D.   On: 08/02/2020 17:16    Procedures Procedures (including critical care time)  Medications Ordered in ED Medications - No data to display  ED Course  I have  reviewed the triage vital signs and the nursing notes.  Pertinent labs & imaging results that were available during my care of the patient were reviewed by me and considered in my medical decision making (see chart for details).    MDM Rules/Calculators/A&P                          54 year old female presents for sudden onset highly tender firm nodule in the right breast noticed today.  She does weekly breast exams and did not notice this last week.  Had a mammogram earlier this year that she reported was normal.  History of breast cancer.  No family history of breast or ovarian cancer.  Exam as above reveals mildly tender, well localized firm nodule without evidence of superimposed infection, abscess, cellulitis.  Skin skin is normal.  She denies any red flags like fever, nipple discharge, trauma, redness, warmth.  DDx includes fibrous breast tissue versus cyst versus malignancy.  Clinical presentation and exam is not consistent with infectious process like cellulitis or abscess.  Given focal tenderness, formal ultrasound was ordered.  This did not show signs of acute abscess.  Radiology recommended formal breast ultrasound mammogram.  Discussed with patient importance of follow-up with PCP/OB/GYN.  States she already messaged her doctor to schedule a visit.  Discussed importance of  follow-up for aforementioned breast ultrasound and possible mammogram.  Recommended NSAIDs for pain.  Return precautions discussed.  She is comfortable this plan.  Final Clinical Impression(s) / ED Diagnoses Final diagnoses:  Pain  Breast nodule  Breast pain    Rx / DC Orders ED Discharge Orders    None       Kinnie Feil, PA-C 08/02/20 1808    Fredia Sorrow, MD 08/13/20 1057

## 2020-08-02 NOTE — ED Notes (Signed)
ED Provider at bedside. 

## 2020-08-02 NOTE — Discharge Instructions (Signed)
You were seen in the ER for right breast nodule, pain  Ultrasound did not show abscess  Cause is unknown at this time  Monitor and return for worsening swelling pain redness warmth pus fever  Please call your primary care or OBGYN provider to discuss outpatient follow up and possible dedicated breast ultrasound and/or mammogram   Alternate ibuprofen or acetaminophen for pain

## 2020-08-04 ENCOUNTER — Other Ambulatory Visit: Payer: Self-pay | Admitting: *Deleted

## 2020-08-04 DIAGNOSIS — N63 Unspecified lump in unspecified breast: Secondary | ICD-10-CM

## 2020-08-04 DIAGNOSIS — N644 Mastodynia: Secondary | ICD-10-CM

## 2021-01-06 ENCOUNTER — Encounter (HOSPITAL_BASED_OUTPATIENT_CLINIC_OR_DEPARTMENT_OTHER): Payer: Self-pay | Admitting: *Deleted

## 2021-01-06 ENCOUNTER — Emergency Department (HOSPITAL_BASED_OUTPATIENT_CLINIC_OR_DEPARTMENT_OTHER)
Admission: EM | Admit: 2021-01-06 | Discharge: 2021-01-06 | Disposition: A | Discharge status: Home / Self Care | Payer: Managed Care, Other (non HMO) | Attending: Emergency Medicine | Admitting: Emergency Medicine

## 2021-01-06 ENCOUNTER — Emergency Department (HOSPITAL_BASED_OUTPATIENT_CLINIC_OR_DEPARTMENT_OTHER): Payer: Managed Care, Other (non HMO)

## 2021-01-06 ENCOUNTER — Other Ambulatory Visit: Payer: Self-pay

## 2021-01-06 DIAGNOSIS — F17210 Nicotine dependence, cigarettes, uncomplicated: Secondary | ICD-10-CM | POA: Diagnosis not present

## 2021-01-06 DIAGNOSIS — R1032 Left lower quadrant pain: Secondary | ICD-10-CM

## 2021-01-06 DIAGNOSIS — R1084 Generalized abdominal pain: Secondary | ICD-10-CM | POA: Diagnosis not present

## 2021-01-06 DIAGNOSIS — R109 Unspecified abdominal pain: Secondary | ICD-10-CM | POA: Diagnosis present

## 2021-01-06 LAB — CBC WITH DIFFERENTIAL/PLATELET
Abs Immature Granulocytes: 0.01 10*3/uL (ref 0.00–0.07)
Basophils Absolute: 0 10*3/uL (ref 0.0–0.1)
Basophils Relative: 1 %
Eosinophils Absolute: 0.2 10*3/uL (ref 0.0–0.5)
Eosinophils Relative: 3 %
HCT: 40.3 % (ref 36.0–46.0)
Hemoglobin: 13.2 g/dL (ref 12.0–15.0)
Immature Granulocytes: 0 %
Lymphocytes Relative: 50 %
Lymphs Abs: 3.6 10*3/uL (ref 0.7–4.0)
MCH: 31.1 pg (ref 26.0–34.0)
MCHC: 32.8 g/dL (ref 30.0–36.0)
MCV: 94.8 fL (ref 80.0–100.0)
Monocytes Absolute: 0.7 10*3/uL (ref 0.1–1.0)
Monocytes Relative: 10 %
Neutro Abs: 2.5 10*3/uL (ref 1.7–7.7)
Neutrophils Relative %: 36 %
Platelets: 296 10*3/uL (ref 150–400)
RBC: 4.25 MIL/uL (ref 3.87–5.11)
RDW: 14.2 % (ref 11.5–15.5)
WBC: 7.1 10*3/uL (ref 4.0–10.5)
nRBC: 0 % (ref 0.0–0.2)

## 2021-01-06 LAB — COMPREHENSIVE METABOLIC PANEL
ALT: 26 U/L (ref 0–44)
AST: 24 U/L (ref 15–41)
Albumin: 4.3 g/dL (ref 3.5–5.0)
Alkaline Phosphatase: 84 U/L (ref 38–126)
Anion gap: 9 (ref 5–15)
BUN: 15 mg/dL (ref 6–20)
CO2: 27 mmol/L (ref 22–32)
Calcium: 9.6 mg/dL (ref 8.9–10.3)
Chloride: 102 mmol/L (ref 98–111)
Creatinine, Ser: 0.69 mg/dL (ref 0.44–1.00)
GFR, Estimated: >60 mL/min (ref >60)
Glucose, Bld: 94 mg/dL (ref 70–99)
Potassium: 3.9 mmol/L (ref 3.5–5.1)
Sodium: 138 mmol/L (ref 135–145)
Total Bilirubin: 0.2 mg/dL — ABNORMAL LOW (ref 0.3–1.2)
Total Protein: 7.9 g/dL (ref 6.5–8.1)

## 2021-01-06 LAB — LACTIC ACID, PLASMA: Lactic Acid, Venous: 0.9 mmol/L (ref 0.5–1.9)

## 2021-01-06 LAB — LIPASE, BLOOD: Lipase: 30 U/L (ref 11–51)

## 2021-01-06 MED ORDER — FENTANYL CITRATE (PF) 100 MCG/2ML IJ SOLN
100.0000 ug | Freq: Once | INTRAMUSCULAR | Status: AC
  Administered 2021-01-06: 100 ug via INTRAVENOUS
  Filled 2021-01-06: qty 2

## 2021-01-06 MED ORDER — SODIUM CHLORIDE 0.9 % IV BOLUS
1000.0000 mL | Freq: Once | INTRAVENOUS | Status: AC
  Administered 2021-01-06: 1000 mL via INTRAVENOUS

## 2021-01-06 MED ORDER — IOHEXOL 300 MG/ML  SOLN
100.0000 mL | Freq: Once | INTRAMUSCULAR | Status: AC | PRN
  Administered 2021-01-06: 100 mL via INTRAVENOUS

## 2021-01-06 MED ORDER — HYDROCODONE-ACETAMINOPHEN 5-325 MG PO TABS: 1.0000 | ORAL_TABLET | Freq: Four times a day (QID) | ORAL | 0 refills | Status: DC | PRN

## 2021-01-06 MED ORDER — MORPHINE SULFATE (PF) 4 MG/ML IV SOLN
4.0000 mg | Freq: Once | INTRAVENOUS | Status: AC
  Administered 2021-01-06: 4 mg via INTRAVENOUS
  Filled 2021-01-06: qty 1

## 2021-01-06 NOTE — ED Provider Notes (Signed)
[name] Provider Note   CSN: [id number] Arrival date & time: 01/06/21  1229     History Chief Complaint  Patient presents with  . Abdominal Pain    [name] is a 55 y.o. female.  55yo F w/ PMH including HLD who p/w abdominal pain. Yesterday, pt had colonscopy by Dr. Marin Comment with [facility] She had multiple polyps removed.  Once she got home, she began having abdominal pain that has progressively worsened and kept her up all night.  Pain is generalized but worse in the left side of the abdomen.  She has had a small amount of blood in stool since the procedure.  She states that she coughed up some phlegm this morning but denies any significant vomiting.  No fevers or urinary symptoms.  No medications prior to arrival.  The history is provided by the patient.  Abdominal Pain      Past Medical History:  Diagnosis Date  . Chest pain 2008   neg work up- dx'd with stress  . Hyperlipidemia   . No pertinent past medical history     Patient Active Problem List   Diagnosis Date Noted  . HYPERLIPIDEMIA 01/05/2011  . EAR PAIN, LEFT 03/09/2009  . ELBOW PAIN 03/09/2009    Past Surgical History:  Procedure Laterality Date  . BREAST SURGERY    . CESAREAN SECTION     x 2  . HAMMER TOE SURGERY    . LAPAROSCOPIC ASSISTED VAGINAL HYSTERECTOMY  02/09/2012   Procedure: LAPAROSCOPIC ASSISTED VAGINAL HYSTERECTOMY;  Surgeon: Marvene Staff, MD;  Location: [facility];  Service: Gynecology;  Laterality: N/A;  Requests 2 hrs.  Also wants a 3 way foley.     OB History    Gravida  2   Para  2   Term      Preterm      AB      Living        SAB      IAB      Ectopic      Multiple      Live Births              No family history on file.  Social History   Tobacco Use  . Smoking status: Current Every Day Smoker    Packs/day: 0.25    Years: 15.00    Pack years: 3.75    Types: Cigarettes  . Smokeless tobacco: Never Used   Substance Use Topics  . Alcohol use: Yes    Comment: occasionally  . Drug use: No    Home Medications Prior to Admission medications   Medication Sig Start Date End Date Taking? Authorizing Provider  atorvastatin (LIPITOR) 40 MG tablet Take 40 mg by mouth daily.    [provider]  cyclobenzaprine (FLEXERIL) 10 MG tablet Take 1 tablet (10 mg total) by mouth 2 (two) times daily as needed for muscle spasms. 05/26/20   Mesner, Corene Cornea, MD  diazepam (VALIUM) 5 MG tablet Take 5 mg by mouth every 8 (eight) hours as needed for anxiety.  06/26/19   [provider]  fexofenadine (ALLEGRA) 60 MG tablet Take 60 mg by mouth daily.    [provider]  fluticasone (FLONASE) 50 MCG/ACT nasal spray Place 1 spray into both nostrils daily.    [provider]  hydrochlorothiazide (HYDRODIURIL) 25 MG tablet Take 1 tablet (25 mg total) by mouth daily. 07/04/19   Molpus, Jenny Reichmann, MD  loratadine (CLARITIN) 10  MG tablet Take 10 mg by mouth daily.    [provider]  meloxicam (MOBIC) 7.5 MG tablet Take 1 tablet (7.5 mg total) by mouth daily. 05/26/20   Mesner, Corene Cornea, MD  montelukast (SINGULAIR) 10 MG tablet Take 10 mg by mouth at bedtime as needed (allergies).     [provider]  nitrofurantoin, macrocrystal-monohydrate, (MACROBID) 100 MG capsule Take 1 capsule (100 mg total) by mouth 2 (two) times daily. X 7 days 07/04/19   Molpus, John, MD  oxyCODONE-acetaminophen (PERCOCET) 5-325 MG tablet Take 1 tablet by mouth every 8 (eight) hours as needed for severe pain. 05/26/20   Mesner, Corene Cornea, MD  Vitamin D, Ergocalciferol, (DRISDOL) 1.25 MG (50000 UT) CAPS capsule Take 50,000 Units by mouth every 7 (seven) days.    [provider]    Allergies    Aspirin and Penicillins  Review of Systems   Review of Systems  Gastrointestinal: Positive for abdominal pain.   All other systems reviewed and are negative except that which was mentioned in HPI  Physical  Exam Updated Vital Signs BP 123/60 (BP Location: Right Arm)   Pulse 68   Temp 98.3 F (36.8 C) (Oral)   Resp 18   Ht 5\' 6"  (1.676 m)   Wt 78 kg   LMP 01/18/2012   SpO2 99%   BMI 27.76 kg/m   Physical Exam Vitals and nursing note reviewed.  Constitutional:      General: She is not in acute distress.    Appearance: Normal appearance.     Comments: uncomfortable  HENT:     Head: Normocephalic and atraumatic.  Eyes:     Conjunctiva/sclera: Conjunctivae normal.  Cardiovascular:     Rate and Rhythm: Normal rate and regular rhythm.     Heart sounds: Normal heart sounds. No murmur heard.   Pulmonary:     Effort: Pulmonary effort is normal.     Breath sounds: Normal breath sounds.  Abdominal:     General: Abdomen is flat. Bowel sounds are normal. There is distension.     Palpations: Abdomen is soft.     Tenderness: There is generalized abdominal tenderness. There is no guarding.     Comments: Mild distension, generalized TTP worst in LUQ and LLQ  Musculoskeletal:     Right lower leg: No edema.     Left lower leg: No edema.  Skin:    General: Skin is warm and dry.  Neurological:     Mental Status: She is alert and oriented to person, place, and time.     Comments: fluent  Psychiatric:        Mood and Affect: Mood normal.        Behavior: Behavior normal.     ED Results / Procedures / Treatments   Labs (all labs ordered are listed, but only abnormal results are displayed) Labs Reviewed  COMPREHENSIVE METABOLIC PANEL  LIPASE, BLOOD  CBC WITH DIFFERENTIAL/PLATELET  URINALYSIS, ROUTINE W REFLEX MICROSCOPIC  LACTIC ACID, PLASMA    EKG None  Radiology No results found.  Procedures Procedures   Medications Ordered in ED Medications  fentaNYL (SUBLIMAZE) injection 100 mcg (has no administration in time range)    ED Course  I have reviewed the triage vital signs and the nursing notes.  Pertinent labs & imaging results that were available during my care of  the patient were reviewed by me and considered in my medical decision making (see chart for details).    MDM Rules/Calculators/A&P  Pt uncomfortable on exam, reassuring VS. Generalized TTP worst in L abdomen. Concern for perforation or diverticulitis. Obtained labs and CT abd/pelvis, gave fentanyl for pain.  Patient signed out to oncoming provider pending results. Final Clinical Impression(s) / ED Diagnoses Final diagnoses:  None    Rx / DC Orders ED Discharge Orders    None       Little, Wenda Overland, MD 01/06/21 1436

## 2021-01-06 NOTE — ED Provider Notes (Signed)
  Physical Exam  BP (!) 150/82 (BP Location: Left Arm)   Pulse (!) 55   Temp 98.3 F (36.8 C) (Oral)   Resp 18   Ht 5\' 6"  (1.676 m)   Wt 78 kg   LMP 01/18/2012   SpO2 99%   BMI 27.76 kg/m   Physical Exam  ED Course/Procedures     Procedures  Angiocath insertion Performed by: Wandra Arthurs  Consent: Verbal consent obtained. Risks and benefits: risks, benefits and alternatives were discussed Time out: Immediately prior to procedure a "time out" was called to verify the correct patient, procedure, equipment, support staff and site/side marked as required.  Preparation: Patient was prepped and draped in the usual sterile fashion.  Vein Location: R antecube  Ultrasound Guided  Gauge: 20 long   Normal blood return and flush without difficulty Patient tolerance: Patient tolerated the procedure well with no immediate complications.     MDM  Patient care assumed at 3 PM.  Patient has abdominal pain after colonoscopy yesterday.  Signout pending CT abdomen pelvis to rule out perforation.  5:51 PM CT did not show any perforation or complications.  Her lab work was unremarkable.  Patient still has left lower quadrant tenderness.  Will discharge home with short course of Vicodin as needed for pain.  I updated her GI doctor Dr. Vista Deck, Lujean Rave, MD 01/06/21 [phone]

## 2021-01-06 NOTE — ED Triage Notes (Signed)
Had Colonoscopy done yesterday and has been cramping to her left side of her abdomen.  Some blood spots on her stools.  Per patient, GI doc removed 9 polyps.

## 2021-01-06 NOTE — ED Notes (Signed)
ED Provider at bedside. 

## 2021-01-06 NOTE — ED Notes (Signed)
1 unsuccessful IV attempt.  Patient refused to be stuck again.  Will request another staff to obtain an IV access.

## 2021-01-06 NOTE — ED Notes (Signed)
Attempted IV x 1 unsuccessful.

## 2021-01-06 NOTE — Discharge Instructions (Signed)
Your CT scan today did not show any complications from the procedure yesterday.   You likely are sore after the procedure   Take tylenol for pain   Take vicodin for severe pain. Do NOT drive with it   See your GI doctor for follow up   Return to ER if you have worse abdominal pain, vomiting, fever

## 2021-01-23 ENCOUNTER — Encounter (HOSPITAL_BASED_OUTPATIENT_CLINIC_OR_DEPARTMENT_OTHER): Payer: Self-pay | Admitting: Emergency Medicine

## 2021-01-23 ENCOUNTER — Other Ambulatory Visit: Payer: Self-pay

## 2021-01-23 ENCOUNTER — Emergency Department (HOSPITAL_BASED_OUTPATIENT_CLINIC_OR_DEPARTMENT_OTHER)
Admission: EM | Admit: 2021-01-23 | Discharge: 2021-01-23 | Disposition: A | Discharge status: Home / Self Care | Payer: Managed Care, Other (non HMO) | Attending: Emergency Medicine | Admitting: Emergency Medicine

## 2021-01-23 DIAGNOSIS — Y9389 Activity, other specified: Secondary | ICD-10-CM | POA: Insufficient documentation

## 2021-01-23 DIAGNOSIS — Y990 Civilian activity done for income or pay: Secondary | ICD-10-CM | POA: Diagnosis not present

## 2021-01-23 DIAGNOSIS — M25561 Pain in right knee: Secondary | ICD-10-CM | POA: Diagnosis not present

## 2021-01-23 DIAGNOSIS — M79605 Pain in left leg: Secondary | ICD-10-CM | POA: Diagnosis not present

## 2021-01-23 DIAGNOSIS — W19XXXA Unspecified fall, initial encounter: Secondary | ICD-10-CM | POA: Insufficient documentation

## 2021-01-23 DIAGNOSIS — Z5321 Procedure and treatment not carried out due to patient leaving prior to being seen by health care provider: Secondary | ICD-10-CM | POA: Diagnosis not present

## 2021-01-23 NOTE — ED Notes (Signed)
Pt walked to nurse desk- states "I have to go". Encouraged pt to stay for eval and offered to see  If provider would be seeing her soon. Pt declined. Walked from department on her own with steady gait, although limping

## 2021-01-23 NOTE — ED Triage Notes (Signed)
States," I fell at work last Aug,and for the last week my right knee and left leg have been hurting me"

## 2022-02-25 IMAGING — CT CT ABD-PELV W/ CM
2 of 5 series · 16 of 46 positions shown, 18 images · IV contrast (Omnipaque)
Comparison: 04/30/2014

CLINICAL DATA: Colonoscopy yesterday with polypectomy, abdominal
pain, cramping, blood in stool

EXAM:
CT ABDOMEN AND PELVIS WITH CONTRAST
TECHNIQUE: Multidetector CT imaging of the abdomen and pelvis was performed
using the standard protocol following bolus administration of
intravenous contrast.
CONTRAST:  100mL OMNIPAQUE IOHEXOL 300 MG/ML  SOLN

[Series 2: axial st · axial · 0.71mm/px · z∈[+600,+980]mm · 13 of 90 slices shown, 15 images]
[im 7/90  soft-tissue]
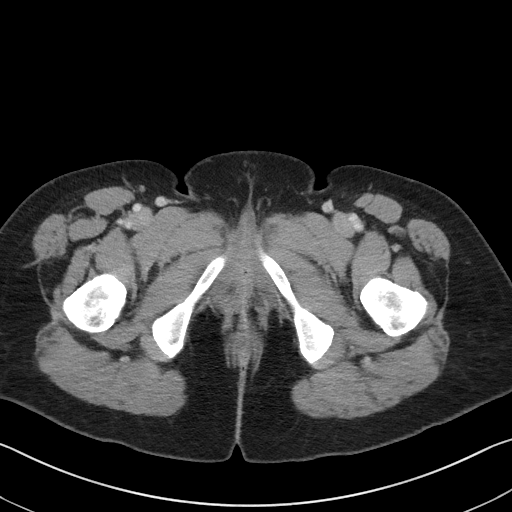
[im 7/90  bone]
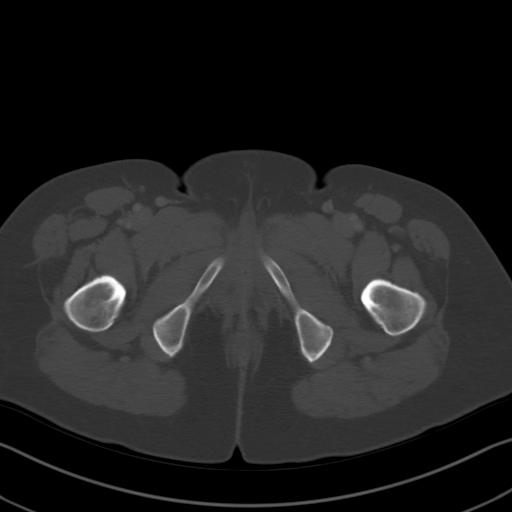
[im 13/90  soft-tissue]
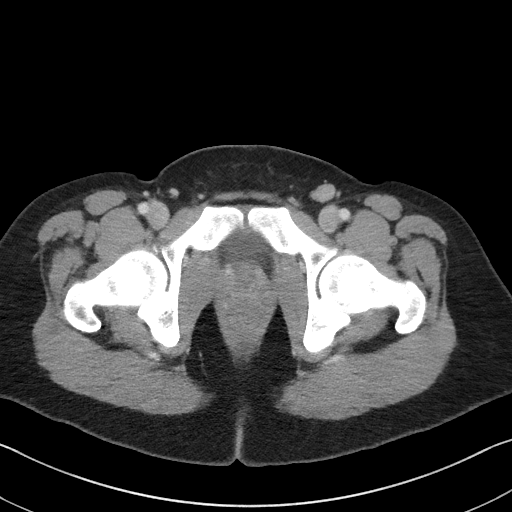
[im 20/90  soft-tissue]
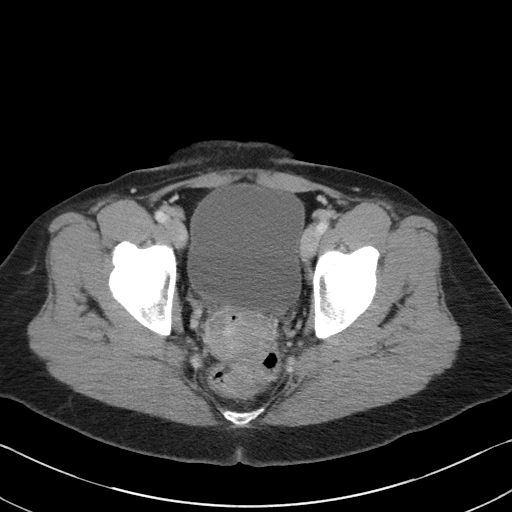
[im 26/90  soft-tissue]
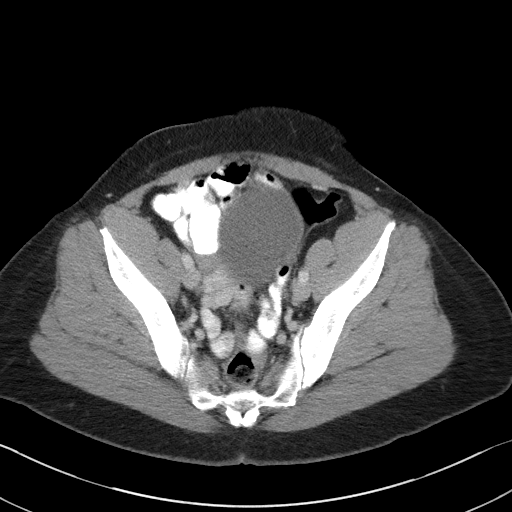
[im 32/90  soft-tissue]
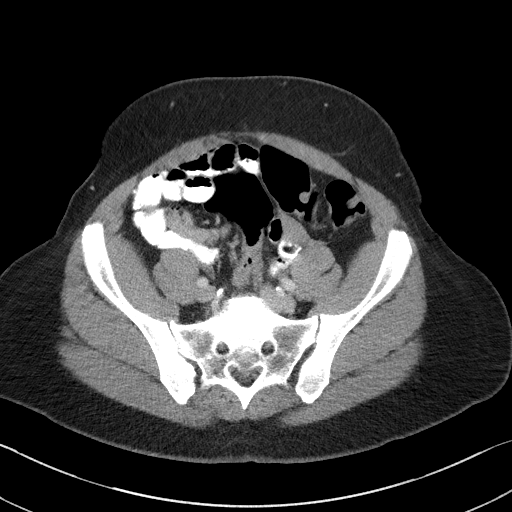
[im 39/90  soft-tissue]
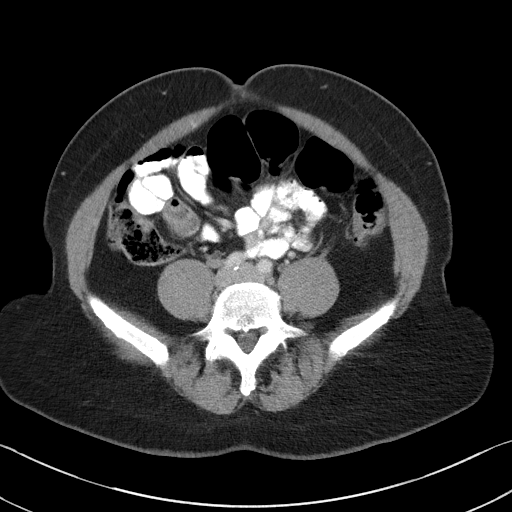
[im 45/90  soft-tissue]
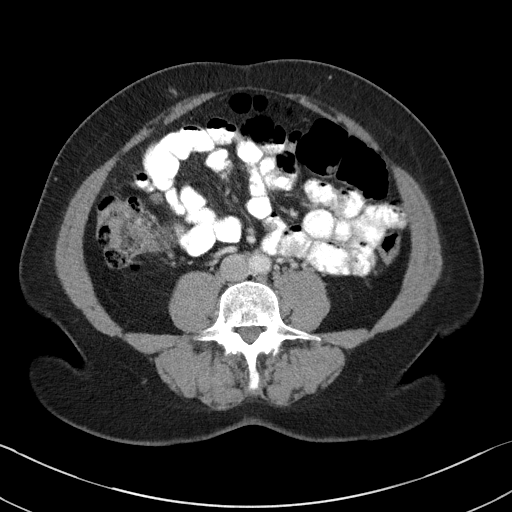
[im 51/90  soft-tissue]
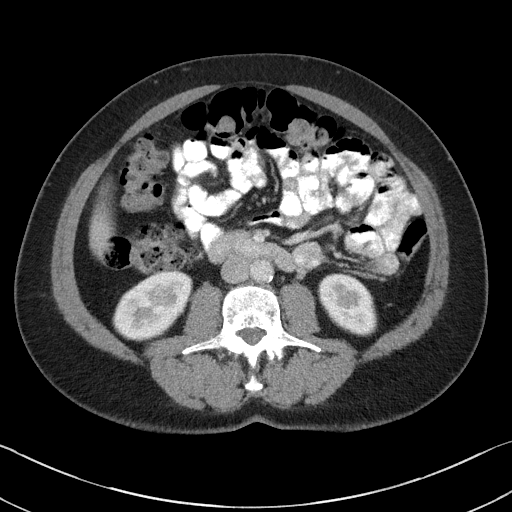
[im 58/90  soft-tissue]
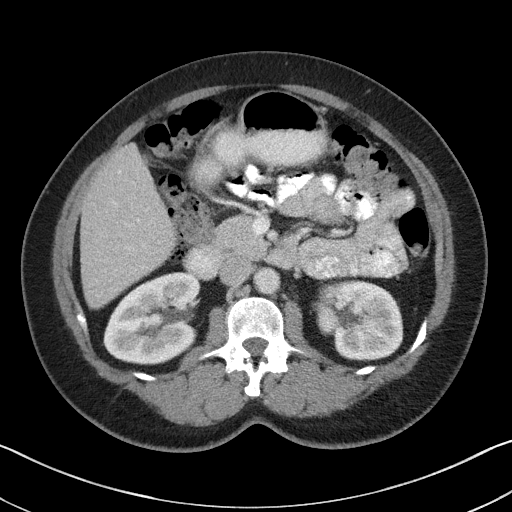
[im 58/90  bone]
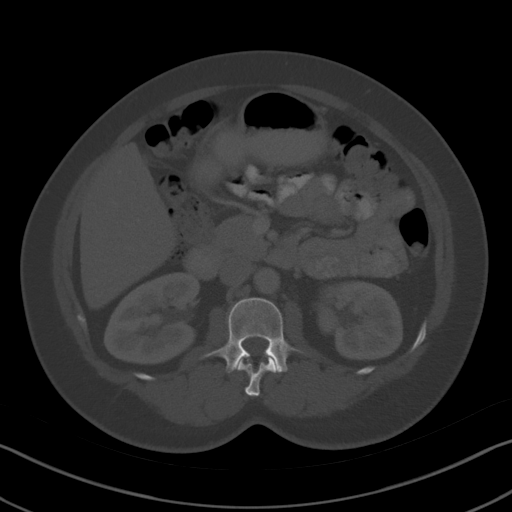
[im 64/90  soft-tissue]
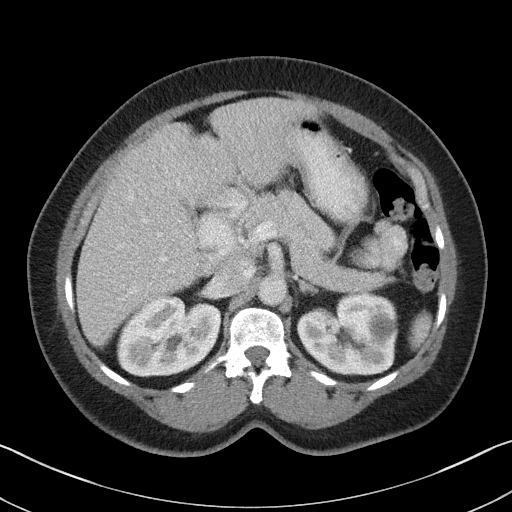
[im 70/90  soft-tissue]
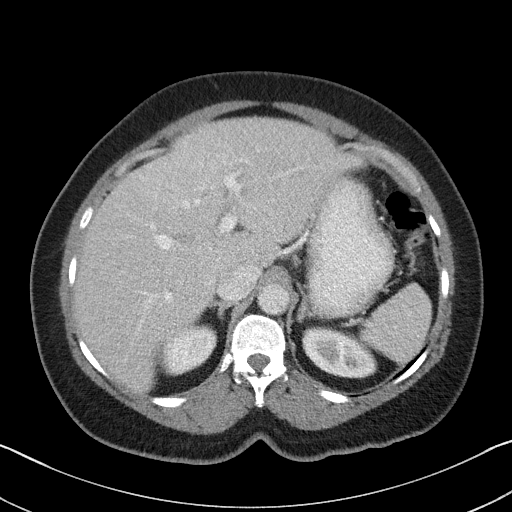
[im 77/90  soft-tissue]
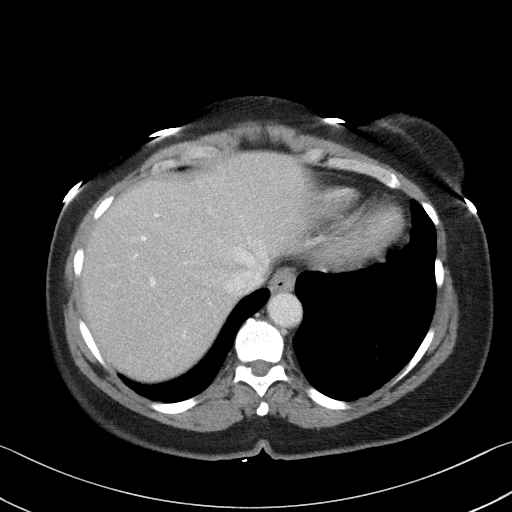
[im 83/90  soft-tissue]
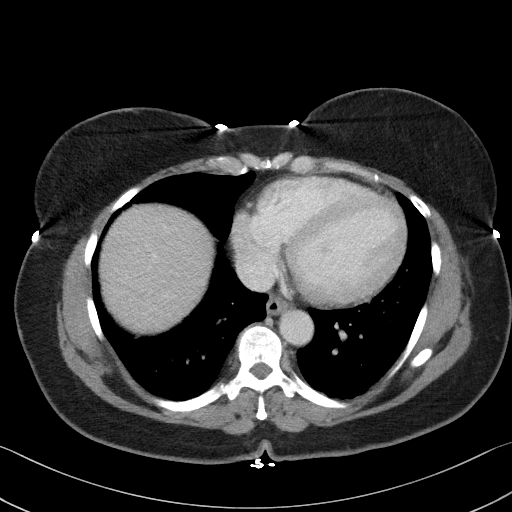

[Series 5: coronal st · coronal · 0.63mm/px · 3 of 101 slices shown]
[im 34/101  soft-tissue]
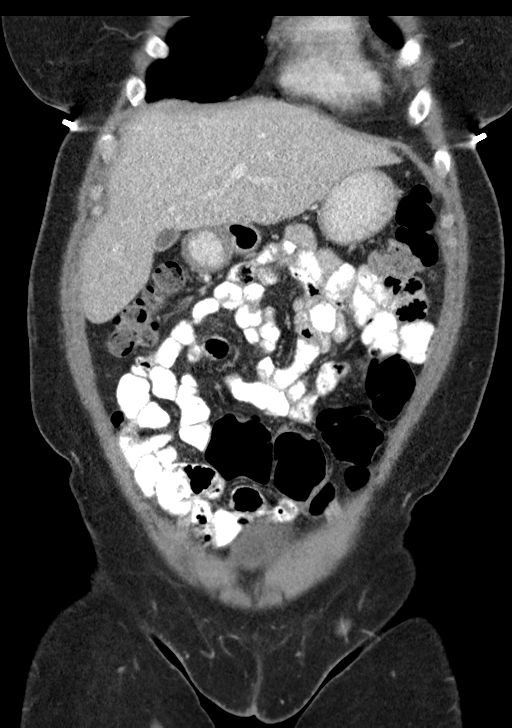
[im 45/101  soft-tissue]
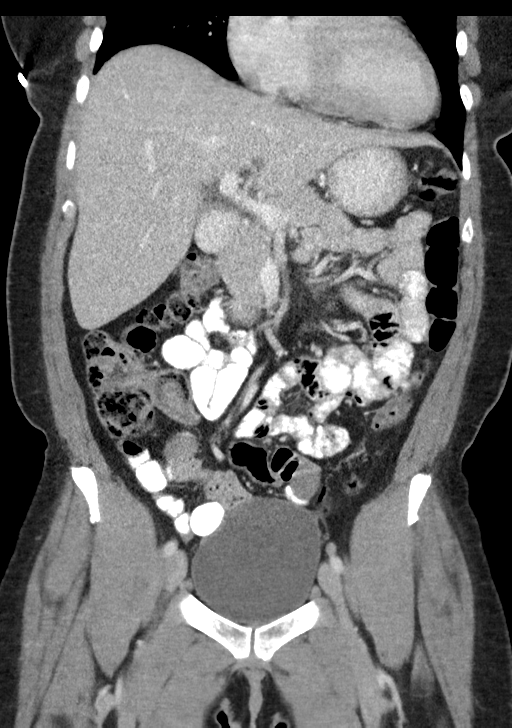
[im 56/101  soft-tissue]
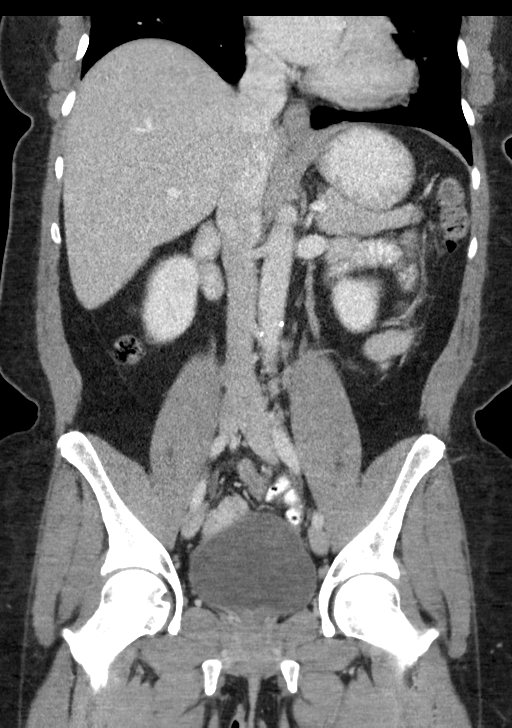

[16 of 46 positions shown; findings below may reference images not displayed]

FINDINGS: Lower chest: No acute pleural or parenchymal lung disease.

Hepatobiliary: No focal liver abnormality is seen. No gallstones,
gallbladder wall thickening, or biliary dilatation.

Pancreas: Unremarkable. No pancreatic ductal dilatation or
surrounding inflammatory changes.

Spleen: Normal in size without focal abnormality.

Adrenals/Urinary Tract: Bilateral simple renal cortical cyst.
Kidneys enhance normally and symmetrically. No urinary tract calculi
or obstruction. Bladder is unremarkable. The adrenals are normal.

Stomach/Bowel: No bowel obstruction or ileus. Normal appendix right
lower quadrant. No bowel wall thickening or inflammatory change.

Vascular/Lymphatic: No significant vascular findings are present. No
enlarged abdominal or pelvic lymph nodes.

Reproductive: Status post hysterectomy. No adnexal masses.

Other: No free fluid or free gas. No abdominal wall hernia.

Musculoskeletal: No acute or destructive bony lesions. Reconstructed
images demonstrate no additional findings.
IMPRESSION: 1. No acute intra-abdominal or intrapelvic process. No evidence of
complication after recent colonoscopy and polypectomy.

## 2022-04-25 ENCOUNTER — Encounter (HOSPITAL_COMMUNITY): Payer: Self-pay

## 2022-04-25 ENCOUNTER — Emergency Department (HOSPITAL_COMMUNITY)
Admission: EM | Admit: 2022-04-25 | Discharge: 2022-04-25 | Disposition: A | Discharge status: Home / Self Care | Payer: Managed Care, Other (non HMO) | Attending: Emergency Medicine | Admitting: Emergency Medicine

## 2022-04-25 DIAGNOSIS — M5442 Lumbago with sciatica, left side: Secondary | ICD-10-CM | POA: Insufficient documentation

## 2022-04-25 DIAGNOSIS — M5417 Radiculopathy, lumbosacral region: Secondary | ICD-10-CM | POA: Diagnosis not present

## 2022-04-25 DIAGNOSIS — M545 Low back pain, unspecified: Secondary | ICD-10-CM | POA: Diagnosis present

## 2022-04-25 DIAGNOSIS — M5441 Lumbago with sciatica, right side: Secondary | ICD-10-CM | POA: Diagnosis not present

## 2022-04-25 MED ORDER — CYCLOBENZAPRINE HCL 10 MG PO TABS: 10.0000 mg | ORAL_TABLET | Freq: Two times a day (BID) | ORAL | 0 refills | Status: AC | PRN

## 2022-04-25 MED ORDER — NAPROXEN 500 MG PO TABS: 500.0000 mg | ORAL_TABLET | Freq: Two times a day (BID) | ORAL | 0 refills | Status: AC

## 2022-04-25 MED ORDER — CYCLOBENZAPRINE HCL 10 MG PO TABS
5.0000 mg | ORAL_TABLET | Freq: Once | ORAL | Status: AC
  Administered 2022-04-25: 5 mg via ORAL
  Filled 2022-04-25: qty 1

## 2022-04-25 MED ORDER — LIDOCAINE 5 % EX PTCH
1.0000 | MEDICATED_PATCH | CUTANEOUS | Status: DC
  Administered 2022-04-25: 1 via TRANSDERMAL
  Filled 2022-04-25: qty 1

## 2022-04-25 MED ORDER — KETOROLAC TROMETHAMINE 30 MG/ML IJ SOLN
30.0000 mg | Freq: Once | INTRAMUSCULAR | Status: AC
  Administered 2022-04-25: 30 mg via INTRAMUSCULAR
  Filled 2022-04-25: qty 1

## 2022-04-25 MED ORDER — LIDOCAINE 5 % EX PTCH: 1.0000 | MEDICATED_PATCH | CUTANEOUS | 0 refills | Status: DC

## 2022-04-25 NOTE — ED Triage Notes (Signed)
C/o severe lower back pain that radiates down both legs.   C/o left shoulder pain radiating to left arm she believes is stemming from her back.   Pt reports falling 2 years ago at work and had PT. Reports she continue to have pain but has been worse this week.   10/10 pain   Denies taking anything for pain today.

## 2022-04-25 NOTE — ED Provider Notes (Signed)
[facility] DEPT Provider Note   CSN: [id number] Arrival date & time: 04/25/22  1211     History  Chief Complaint  Patient presents with   Back Pain    [name] is a 56 y.o. female.   Back Pain  56 year old female with a history of chronic low back pain with radiculopathy presenting to the emergency part with acute worsening in her low back pain with radiculopathy.  The patient states that she was lifting a small puppy dog 4 days ago and developed worsening back pain with radiculopathy down her bilateral legs.  She endorses subjective numbness in the bilateral lower extremities but denies any new weakness in the lower extremities.  She denies any saddle anesthesia.  She denies any bowel or bladder incontinence.  She denies any fever or chills.  She denies any recent falls.  She states that she fell 2 years ago at work and had physical therapy.  She endorses 10 out of 10 pain in her lower back with radiation down both legs.  Home Medications Prior to Admission medications   Medication Sig Start Date End Date Taking? Authorizing Provider  cyclobenzaprine (FLEXERIL) 10 MG tablet Take 1 tablet (10 mg total) by mouth 2 (two) times daily as needed for muscle spasms. 04/25/22  Yes Regan Lemming, MD  lidocaine (LIDODERM) 5 % Place 1 patch onto the skin daily. Remove & Discard patch within 12 hours or as directed by MD 04/25/22  Yes Regan Lemming, MD  naproxen (NAPROSYN) 500 MG tablet Take 1 tablet (500 mg total) by mouth 2 (two) times daily. 04/25/22  Yes Regan Lemming, MD  atorvastatin (LIPITOR) 40 MG tablet Take 40 mg by mouth daily.    [provider]  fexofenadine (ALLEGRA) 60 MG tablet Take 60 mg by mouth daily.    [provider]  fluticasone (FLONASE) 50 MCG/ACT nasal spray Place 1 spray into both nostrils daily.    [provider]  hydrochlorothiazide (HYDRODIURIL) 25 MG tablet Take 1 tablet (25 mg total) by mouth daily.  07/04/19   Molpus, John, MD  loratadine (CLARITIN) 10 MG tablet Take 10 mg by mouth daily.    [provider]  montelukast (SINGULAIR) 10 MG tablet Take 10 mg by mouth at bedtime as needed (allergies).     [provider]  Vitamin D, Ergocalciferol, (DRISDOL) 1.25 MG (50000 UT) CAPS capsule Take 50,000 Units by mouth every 7 (seven) days.    [provider]      Allergies    Aspirin and Penicillins    Review of Systems   Review of Systems  Musculoskeletal:  Positive for back pain.  All other systems reviewed and are negative.  Physical Exam Updated Vital Signs BP (!) 133/110 (BP Location: Right Arm)   Pulse (!) 59   Temp 97.7 F (36.5 C) (Oral)   Resp 18   Ht '5\' 6"'$  (1.676 m)   Wt 74.1 kg   LMP 01/18/2012   SpO2 97%   BMI 26.37 kg/m  Physical Exam Vitals and nursing note reviewed.  Constitutional:      General: She is not in acute distress.    Appearance: She is well-developed.  HENT:     Head: Normocephalic and atraumatic.  Eyes:     Conjunctiva/sclera: Conjunctivae normal.  Cardiovascular:     Rate and Rhythm: Normal rate and regular rhythm.     Heart sounds: No murmur heard. Pulmonary:     Effort: Pulmonary effort is normal.  No respiratory distress.     Breath sounds: Normal breath sounds.  Abdominal:     Palpations: Abdomen is soft.     Tenderness: There is no abdominal tenderness.  Musculoskeletal:        General: No swelling or tenderness.     Cervical back: Neck supple.     Comments: No midline tenderness of the cervical, thoracic or lumbar spine, no paraspinal muscle tenderness.  Positive straight leg raise test bilaterally  Skin:    General: Skin is warm and dry.     Capillary Refill: Capillary refill takes less than 2 seconds.  Neurological:     Mental Status: She is alert.     Comments: 5 out of 5 strength in the bilateral lower extremities.  Intact sensation to light touch bilaterally with no sensory deficit  Psychiatric:         Mood and Affect: Mood normal.    ED Results / Procedures / Treatments   Labs (all labs ordered are listed, but only abnormal results are displayed) Labs Reviewed - No data to display  EKG None  Radiology No results found.  Procedures Procedures    Medications Ordered in ED Medications  ketorolac (TORADOL) 30 MG/ML injection 30 mg (has no administration in time range)  cyclobenzaprine (FLEXERIL) tablet 5 mg (has no administration in time range)  lidocaine (LIDODERM) 5 % 1 patch (has no administration in time range)    ED Course/ Medical Decision Making/ A&P                           Medical Decision Making Risk Prescription drug management.    56 year old female with a history of chronic low back pain with radiculopathy presenting to the emergency part with acute worsening in her low back pain with radiculopathy.  The patient states that she was lifting a small puppy dog 4 days ago and developed worsening back pain with radiculopathy down her bilateral legs.  She endorses subjective numbness in the bilateral lower extremities but denies any new weakness in the lower extremities.  She denies any saddle anesthesia.  She denies any bowel or bladder incontinence.  She denies any fever or chills.  She denies any recent falls.  She states that she fell 2 years ago at work and had physical therapy.  She endorses 10 out of 10 pain in her lower back with radiation down both legs.  The patient is able to ambulate and is hemodynamically stable. There are no red flag symptoms. Specifically, she denies: -Being on an anticoagulant or blood thinner -Using IV drugs -Having a history of AAA -Having saddle anesthesia -Having urinary or fecal incontinence -Having any recent falls or trauma   Differential Diagnoses: I do not think that [name] is experiencing cauda equina syndrome, abdominal aortic aneurysm, epidural abscess, aortic dissection, spinal hematoma, nephrolithiasis,  spinal metastasis, discitis, or an acute fracture.  The patient is able to ambulate.  While in the ED, I provided the patient with: Medications  ketorolac (TORADOL) 30 MG/ML injection 30 mg (has no administration in time range)  cyclobenzaprine (FLEXERIL) tablet 5 mg (has no administration in time range)  lidocaine (LIDODERM) 5 % 1 patch (has no administration in time range)    On reassessment, the patient was stable. This presentation is most consistent with lumbosacral radiculopathy, likely from degenerative disc disease. The patient has an orthopedist she can follow-up with.   Given the patient's reassuring presentation, I believe  that she is safe for discharge.  I provided ED return precautions, specifically for the symptoms which are most concerning (e.g., saddle anesthesia, urinary or bowel incontinence or retention, changing or worsening pain), which would necessitate immediate return.  I encouraged the patient to followup with their PCP and ortho as needed.   Final Clinical Impression(s) / ED Diagnoses Final diagnoses:  Lumbosacral radiculopathy  Acute midline low back pain with bilateral sciatica    Rx / DC Orders ED Discharge Orders          Ordered    cyclobenzaprine (FLEXERIL) 10 MG tablet  2 times daily PRN        04/25/22 1623    naproxen (NAPROSYN) 500 MG tablet  2 times daily        04/25/22 1623    lidocaine (LIDODERM) 5 %  Every 24 hours        04/25/22 1623              Regan Lemming, MD 04/25/22 1624

## 2022-07-03 ENCOUNTER — Emergency Department (HOSPITAL_COMMUNITY)
Admission: EM | Admit: 2022-07-03 | Discharge: 2022-07-03 | Disposition: A | Discharge status: Home / Self Care | Payer: Managed Care, Other (non HMO) | Attending: Emergency Medicine | Admitting: Emergency Medicine

## 2022-07-03 ENCOUNTER — Encounter (HOSPITAL_COMMUNITY): Payer: Self-pay

## 2022-07-03 ENCOUNTER — Other Ambulatory Visit: Payer: Self-pay

## 2022-07-03 ENCOUNTER — Emergency Department (HOSPITAL_COMMUNITY): Payer: Managed Care, Other (non HMO)

## 2022-07-03 DIAGNOSIS — R079 Chest pain, unspecified: Secondary | ICD-10-CM | POA: Diagnosis present

## 2022-07-03 DIAGNOSIS — M940 Chondrocostal junction syndrome [Tietze]: Secondary | ICD-10-CM | POA: Diagnosis not present

## 2022-07-03 LAB — CBC WITH DIFFERENTIAL/PLATELET
Abs Immature Granulocytes: 0 10*3/uL (ref 0.00–0.07)
Basophils Absolute: 0 10*3/uL (ref 0.0–0.1)
Basophils Relative: 1 %
Eosinophils Absolute: 0.3 10*3/uL (ref 0.0–0.5)
Eosinophils Relative: 4 %
HCT: 39.2 % (ref 36.0–46.0)
Hemoglobin: 13 g/dL (ref 12.0–15.0)
Immature Granulocytes: 0 %
Lymphocytes Relative: 49 %
Lymphs Abs: 3.3 10*3/uL (ref 0.7–4.0)
MCH: 31.9 pg (ref 26.0–34.0)
MCHC: 33.2 g/dL (ref 30.0–36.0)
MCV: 96.3 fL (ref 80.0–100.0)
Monocytes Absolute: 0.6 10*3/uL (ref 0.1–1.0)
Monocytes Relative: 10 %
Neutro Abs: 2.3 10*3/uL (ref 1.7–7.7)
Neutrophils Relative %: 36 %
Platelets: 252 10*3/uL (ref 150–400)
RBC: 4.07 MIL/uL (ref 3.87–5.11)
RDW: 14.8 % (ref 11.5–15.5)
WBC: 6.5 10*3/uL (ref 4.0–10.5)
nRBC: 0 % (ref 0.0–0.2)

## 2022-07-03 LAB — COMPREHENSIVE METABOLIC PANEL
ALT: 19 U/L (ref 0–44)
AST: 24 U/L (ref 15–41)
Albumin: 4.1 g/dL (ref 3.5–5.0)
Alkaline Phosphatase: 83 U/L (ref 38–126)
Anion gap: 10 (ref 5–15)
BUN: 17 mg/dL (ref 6–20)
CO2: 24 mmol/L (ref 22–32)
Calcium: 9.4 mg/dL (ref 8.9–10.3)
Chloride: 108 mmol/L (ref 98–111)
Creatinine, Ser: 0.75 mg/dL (ref 0.44–1.00)
GFR, Estimated: >60 mL/min (ref >60)
Glucose, Bld: 113 mg/dL — ABNORMAL HIGH (ref 70–99)
Potassium: 3.8 mmol/L (ref 3.5–5.1)
Sodium: 142 mmol/L (ref 135–145)
Total Bilirubin: 0.2 mg/dL — ABNORMAL LOW (ref 0.3–1.2)
Total Protein: 7.5 g/dL (ref 6.5–8.1)

## 2022-07-03 LAB — TROPONIN I (HIGH SENSITIVITY): Troponin I (High Sensitivity): 3 ng/L (ref <18)

## 2022-07-03 LAB — LIPASE, BLOOD: Lipase: 29 U/L (ref 11–51)

## 2022-07-03 MED ORDER — IBUPROFEN 200 MG PO TABS
600.0000 mg | ORAL_TABLET | Freq: Once | ORAL | Status: AC
Start: 2022-07-03 — End: 2022-07-03
  Administered 2022-07-03: 600 mg via ORAL
  Filled 2022-07-03: qty 3

## 2022-07-03 NOTE — ED Provider Triage Note (Signed)
Emergency Medicine Provider Triage Evaluation Note  Katlynne Mckercher , a 56 y.o. female  was evaluated in triage.  Pt complains of constant chest pain.  She reports that her feeling light heads intermittent, worse when she bends down.   She reports cough over the past week. No fever, no N/V/D. Her chest pain goes into her back.  She reports nasal congestion and sinus headache    Physical Exam  BP (!) 149/84   Pulse 74   Temp (!) 97.5 F (36.4 C)   Resp 19   Ht '5\' 6"'$  (1.676 m)   Wt 74.4 kg   LMP 01/18/2012   SpO2 95%   BMI 26.47 kg/m  Gen:   Awake, no distress   Resp:  Normal effort, lungs ctab MSK:   Moves extremities without difficulty  Other:  RRR  Medical Decision Making  Medically screening exam initiated at 4:46 PM.  Appropriate orders placed.  Vianey Caniglia was informed that the remainder of the evaluation will be completed by another provider, this initial triage assessment does not replace that evaluation, and the importance of remaining in the ED until their evaluation is complete.     Lorin Glass, [place] 07/03/22 1651

## 2022-07-03 NOTE — Discharge Instructions (Signed)
You were seen in the emergency department for your chest pain.  Your work-up showed no signs of heart attack, abnormal heart rhythms, or pneumonia as causes of your pain.  You did have significant tenderness when pushing on your chest, concerning for costochondritis which is inflammation of the chest wall muscles.  You can take Motrin as needed for your chest pain.  You should follow-up with your primary doctor in the next few days to have your symptoms rechecked.  You should return to the emergency department if your pain significantly worsens, you have worsening shortness of breath, you pass out, or if you have any other new or concerning symptoms.

## 2022-07-03 NOTE — ED Notes (Signed)
Unable to obtain IV/labs due to poor venous access.  IV team order consult placed.

## 2022-07-03 NOTE — ED Triage Notes (Signed)
Pt c/o chest pain and some intermittent sob over the past week. Reports she occasionally feels dizzy as well.

## 2022-07-03 NOTE — ED Provider Notes (Signed)
[facility] DEPT Provider Note   CSN: [id number] Arrival date & time: 07/03/22  1558     History  Chief Complaint  Patient presents with   Chest Pain   Dizziness    [name] is a 56 y.o. female.  Patient is a 56 year old female with past medical history of seasonal allergies presenting to the emergency department with chest pain.  Patient states that for the last week she has had sharp chest pain that radiates across her chest.  She states it has been constant since it started.  She states it is worse with movement.  She states she has had a mild nonproductive cough.  She denies any fevers or chills.  She states she has felt lightheaded and dizzy.  She denies any nausea, vomiting, diarrhea or constipation, fevers or chills.  She denies any lower extremity swelling.  She denies any history of blood clots, recent surgeries or hospitalization, recent long travels in the car or plane, cancer history or hormone use.  She states she was tested for COVID on Friday that was negative.  The history is provided by the patient.  Chest Pain Associated symptoms: dizziness   Dizziness Associated symptoms: chest pain        Home Medications Prior to Admission medications   Medication Sig Start Date End Date Taking? Authorizing Provider  albuterol (VENTOLIN HFA) 108 (90 Base) MCG/ACT inhaler Inhale 2 puffs into the lungs every 6 (six) hours as needed for shortness of breath or wheezing. 02/08/22  Yes [provider]  atorvastatin (LIPITOR) 40 MG tablet Take 40 mg by mouth daily.   Yes [provider]  cyclobenzaprine (FLEXERIL) 10 MG tablet Take 1 tablet (10 mg total) by mouth 2 (two) times daily as needed for muscle spasms. 04/25/22  Yes Regan Lemming, MD  EPINEPHrine 0.3 mg/0.3 mL IJ SOAJ injection Inject 0.3 mg into the muscle as needed for anaphylaxis. 02/09/22  Yes [provider]  fexofenadine (ALLEGRA) 60 MG tablet Take 60 mg by  mouth daily.   Yes [provider]  fluticasone (FLONASE) 50 MCG/ACT nasal spray Place 1 spray into both nostrils daily.   Yes [provider]  loratadine (CLARITIN) 10 MG tablet Take 10 mg by mouth daily.   Yes [provider]  montelukast (SINGULAIR) 10 MG tablet Take 10 mg by mouth at bedtime as needed (allergies).    Yes [provider]  Vitamin D, Ergocalciferol, (DRISDOL) 1.25 MG (50000 UT) CAPS capsule Take 50,000 Units by mouth every 7 (seven) days.   Yes [provider]  hydrochlorothiazide (HYDRODIURIL) 25 MG tablet Take 1 tablet (25 mg total) by mouth daily. Patient not taking: Reported on 07/03/2022 07/04/19   Molpus, John, MD  lidocaine (LIDODERM) 5 % Place 1 patch onto the skin daily. Remove & Discard patch within 12 hours or as directed by MD Patient not taking: Reported on 07/03/2022 04/25/22   Regan Lemming, MD  naproxen (NAPROSYN) 500 MG tablet Take 1 tablet (500 mg total) by mouth 2 (two) times daily. Patient not taking: Reported on 07/03/2022 04/25/22   Regan Lemming, MD      Allergies    Aspirin and Penicillins    Review of Systems   Review of Systems  Cardiovascular:  Positive for chest pain.  Neurological:  Positive for dizziness.    Physical Exam Updated Vital Signs BP (!) 166/88   Pulse 68   Temp 98 F (36.7 C) (Oral)   Resp 17  Ht '5\' 6"'$  (1.676 m)   Wt 74.4 kg   LMP 01/18/2012   SpO2 96%   BMI 26.47 kg/m  Physical Exam Vitals and nursing note reviewed.  Constitutional:      General: She is not in acute distress.    Appearance: She is well-developed.  HENT:     Head: Normocephalic and atraumatic.  Eyes:     Extraocular Movements: Extraocular movements intact.  Cardiovascular:     Rate and Rhythm: Normal rate and regular rhythm.     Pulses:          Radial pulses are 2+ on the right side and 2+ on the left side.     Heart sounds: Normal heart sounds.  Pulmonary:     Effort: Pulmonary effort is normal.      Breath sounds: Normal breath sounds.  Chest:     Chest wall: Tenderness (Point tenderness to left sternal border, no overlying skin changes) present.  Abdominal:     Palpations: Abdomen is soft.     Tenderness: There is no abdominal tenderness.  Musculoskeletal:        General: Normal range of motion.     Cervical back: Normal range of motion and neck supple.     Right lower leg: No edema.     Left lower leg: No edema.  Skin:    General: Skin is warm and dry.  Neurological:     General: No focal deficit present.     Mental Status: She is alert and oriented to person, place, and time.  Psychiatric:        Mood and Affect: Mood normal.        Behavior: Behavior normal.     ED Results / Procedures / Treatments   Labs (all labs ordered are listed, but only abnormal results are displayed) Labs Reviewed  COMPREHENSIVE METABOLIC PANEL - Abnormal; Notable for the following components:      Result Value   Glucose, Bld 113 (*)    Total Bilirubin 0.2 (*)    All other components within normal limits  CBC WITH DIFFERENTIAL/PLATELET  LIPASE, BLOOD  TROPONIN I (HIGH SENSITIVITY)    EKG EKG Interpretation  Date/Time:  Sunday July 03 2022 19:52:32 EDT Ventricular Rate:  57 PR Interval:  147 QRS Duration: 88 QT Interval:  396 QTC Calculation: 386 R Axis:   25 Text Interpretation: Sinus rhythm T wave inversions, inferior leads Borderline ST elevation, lateral leads No significant change since last tracing Confirmed by Oneal Deputy [phone]) on 07/03/2022 8:49:00 PM  Radiology DG Chest 2 View  Result Date: 07/03/2022 CLINICAL DATA:  chest pain EXAM: CHEST - 2 VIEW COMPARISON:  July 03, 2019 FINDINGS: The cardiomediastinal silhouette is normal in contour. No pleural effusion. No pneumothorax. No acute pleuroparenchymal abnormality. Visualized abdomen is unremarkable. No acute osseous abnormality noted. IMPRESSION: No acute cardiopulmonary abnormality. Electronically Signed   By:  Valentino Saxon M.D.   On: 07/03/2022 17:18    Procedures Procedures    Medications Ordered in ED Medications  ibuprofen (ADVIL) tablet 600 mg (has no administration in time range)    ED Course/ Medical Decision Making/ A&P                           Medical Decision Making Patient is a 56 year old female with past medical history of seasonal allergies presenting to the emergency department with chest pain.  Patient was initially evaluated by triage PA and  had labs, EKG and chest x-ray performed to evaluate for ACS, arrhythmia, pneumonia, pneumothorax, pleural effusions as possible causes of her chest pain.  Patient's labs and EKG reviewed and interpreted by myself-EKG without acute ischemic changes or arrhythmia and troponin is negative making ACS unlikely and the labs are within normal range.  Chest x-ray shows no acute disease.  She is low risk by Wells criteria for PE, making PE unlikely.  The patient does have point tenderness to her chest wall, concerning for costochondritis.  She will be treated with NSAIDs.  She is stable for discharge home with outpatient primary care follow-up.  Her questions were answered and she was given strict return precautions.  Amount and/or Complexity of Data Reviewed Labs:  Decision-making details documented in ED Course. Radiology:  Decision-making details documented in ED Course. ECG/medicine tests:  Decision-making details documented in ED Course.  Risk OTC drugs.           Final Clinical Impression(s) / ED Diagnoses Final diagnoses:  Costochondritis    Rx / DC Orders ED Discharge Orders     None         Ottie Glazier, DO 07/03/22 2110

## 2022-07-03 NOTE — ED Notes (Signed)
In attempted to collect labs and was unsuccessful

## 2022-08-19 DIAGNOSIS — I491 Atrial premature depolarization: Secondary | ICD-10-CM | POA: Insufficient documentation

## 2022-08-21 ENCOUNTER — Emergency Department (HOSPITAL_COMMUNITY)
Admission: EM | Admit: 2022-08-21 | Discharge: 2022-08-22 | Disposition: A | Discharge status: Home / Self Care | Payer: Managed Care, Other (non HMO) | Attending: Emergency Medicine | Admitting: Emergency Medicine

## 2022-08-21 ENCOUNTER — Emergency Department (HOSPITAL_COMMUNITY): Payer: Managed Care, Other (non HMO)

## 2022-08-21 DIAGNOSIS — F17200 Nicotine dependence, unspecified, uncomplicated: Secondary | ICD-10-CM | POA: Insufficient documentation

## 2022-08-21 DIAGNOSIS — N9489 Other specified conditions associated with female genital organs and menstrual cycle: Secondary | ICD-10-CM | POA: Insufficient documentation

## 2022-08-21 DIAGNOSIS — R079 Chest pain, unspecified: Secondary | ICD-10-CM | POA: Diagnosis present

## 2022-08-21 DIAGNOSIS — R519 Headache, unspecified: Secondary | ICD-10-CM | POA: Diagnosis not present

## 2022-08-21 DIAGNOSIS — R0789 Other chest pain: Secondary | ICD-10-CM | POA: Insufficient documentation

## 2022-08-22 ENCOUNTER — Other Ambulatory Visit: Payer: Self-pay

## 2022-08-22 ENCOUNTER — Emergency Department (HOSPITAL_COMMUNITY): Payer: Managed Care, Other (non HMO)

## 2022-08-22 LAB — BASIC METABOLIC PANEL
Anion gap: 7 (ref 5–15)
BUN: 18 mg/dL (ref 6–20)
CO2: 25 mmol/L (ref 22–32)
Calcium: 9.5 mg/dL (ref 8.9–10.3)
Chloride: 107 mmol/L (ref 98–111)
Creatinine, Ser: 0.61 mg/dL (ref 0.44–1.00)
GFR, Estimated: >60 mL/min (ref >60)
Glucose, Bld: 102 mg/dL — ABNORMAL HIGH (ref 70–99)
Potassium: 3.6 mmol/L (ref 3.5–5.1)
Sodium: 139 mmol/L (ref 135–145)

## 2022-08-22 LAB — CBC
HCT: 41.5 % (ref 36.0–46.0)
Hemoglobin: 13.6 g/dL (ref 12.0–15.0)
MCH: 31.6 pg (ref 26.0–34.0)
MCHC: 32.8 g/dL (ref 30.0–36.0)
MCV: 96.5 fL (ref 80.0–100.0)
Platelets: 276 10*3/uL (ref 150–400)
RBC: 4.3 MIL/uL (ref 3.87–5.11)
RDW: 14.4 % (ref 11.5–15.5)
WBC: 7.7 10*3/uL (ref 4.0–10.5)
nRBC: 0 % (ref 0.0–0.2)

## 2022-08-22 LAB — HEPATIC FUNCTION PANEL
ALT: 27 U/L (ref 0–44)
AST: 35 U/L (ref 15–41)
Albumin: 3.9 g/dL (ref 3.5–5.0)
Alkaline Phosphatase: 81 U/L (ref 38–126)
Bilirubin, Direct: 0.6 mg/dL — ABNORMAL HIGH (ref 0.0–0.2)
Indirect Bilirubin: 0.9 mg/dL (ref 0.3–0.9)
Total Bilirubin: 1.5 mg/dL — ABNORMAL HIGH (ref 0.3–1.2)
Total Protein: 7.7 g/dL (ref 6.5–8.1)

## 2022-08-22 LAB — I-STAT BETA HCG BLOOD, ED (MC, WL, AP ONLY): I-stat hCG, quantitative: <5 m[IU]/mL (ref <5)

## 2022-08-22 LAB — LIPASE, BLOOD: Lipase: 29 U/L (ref 11–51)

## 2022-08-22 LAB — TROPONIN I (HIGH SENSITIVITY)
Troponin I (High Sensitivity): 5 ng/L (ref <18)
Troponin I (High Sensitivity): 4 ng/L (ref <18)

## 2022-08-22 MED ORDER — METOCLOPRAMIDE HCL 5 MG/ML IJ SOLN
10.0000 mg | Freq: Once | INTRAMUSCULAR | Status: AC
  Administered 2022-08-22: 10 mg via INTRAVENOUS
  Filled 2022-08-22: qty 2

## 2022-08-22 MED ORDER — OXYCODONE HCL 5 MG PO TABS
5.0000 mg | ORAL_TABLET | Freq: Once | ORAL | Status: AC
  Administered 2022-08-22: 5 mg via ORAL
  Filled 2022-08-22: qty 1

## 2022-08-22 MED ORDER — ACETAMINOPHEN 325 MG PO TABS
650.0000 mg | ORAL_TABLET | Freq: Once | ORAL | Status: AC
  Administered 2022-08-22: 650 mg via ORAL
  Filled 2022-08-22: qty 2

## 2022-08-22 NOTE — ED Triage Notes (Signed)
Pt reports with chest pain, HTN, headache, and tingling down her left arm that started 08/21/22. Pt denies any medical hx.

## 2022-08-22 NOTE — Discharge Instructions (Addendum)
Take Tylenol and Naprosyn as needed for headache.  Follow-up with Dr. Edd Arbour for further cardiology work-up.  Return to the emergency department at any time for any new or worsening symptoms of concern.

## 2022-08-22 NOTE — ED Provider Notes (Signed)
[facility] DEPT Provider Note   CSN: [id number] Arrival date & time: 08/21/22  2344     History  Chief Complaint  Patient presents with   Chest Pain   Hypertension    [name] is a 56 y.o. female.  The history is provided by the patient and medical records.  Chest Pain Hypertension Associated symptoms include chest pain.  [name] is a 56 y.o. female who presents to the Emergency Department complaining of chest pain and headache.  She presents to the emergency department for evaluation of headache that started on Saturday.  She stated that initially felt like a sinus headache but then worsened on Sunday with associated blurred vision.  She checked her blood pressure on Sunday and noticed that it was elevated to 173/101.  At 1030 Sunday night she developed chest pain that was sharp and located in the left breast.  She has been having episodes that come and go and now they are sometimes on the left and sometimes on the right.  Episodes last up to 5 minutes at the time.  She also experiences intermittent numbness to the left arm and left face.  These episodes wax and wane, not currently feeling.  No associated abdominal pain.  Does have nausea.  No vomiting, shortness of breath, dysuria, leg swelling or pain.  She has no known medical problems and takes no medications.  She has been seen by cardiology for evaluation of irregular heartbeat and has a plan to have an echo.  She smokes tobacco.  Occasional alcohol use.  No drug use.  No CP.  She has a family history of hypertension and diabetes.  No personal or family history of clotting disorder.   Home Medications Prior to Admission medications   Medication Sig Start Date End Date Taking? Authorizing Provider  albuterol (VENTOLIN HFA) 108 (90 Base) MCG/ACT inhaler Inhale 2 puffs into the lungs every 6 (six) hours as needed for shortness of breath or wheezing. 02/08/22   [provider]   atorvastatin (LIPITOR) 40 MG tablet Take 40 mg by mouth daily.    [provider]  cyclobenzaprine (FLEXERIL) 10 MG tablet Take 1 tablet (10 mg total) by mouth 2 (two) times daily as needed for muscle spasms. 04/25/22   Regan Lemming, MD  EPINEPHrine 0.3 mg/0.3 mL IJ SOAJ injection Inject 0.3 mg into the muscle as needed for anaphylaxis. 02/09/22   [provider]  fexofenadine (ALLEGRA) 60 MG tablet Take 60 mg by mouth daily.    [provider]  fluticasone (FLONASE) 50 MCG/ACT nasal spray Place 1 spray into both nostrils daily.    [provider]  hydrochlorothiazide (HYDRODIURIL) 25 MG tablet Take 1 tablet (25 mg total) by mouth daily. Patient not taking: Reported on 07/03/2022 07/04/19   Molpus, John, MD  lidocaine (LIDODERM) 5 % Place 1 patch onto the skin daily. Remove & Discard patch within 12 hours or as directed by MD Patient not taking: Reported on 07/03/2022 04/25/22   Regan Lemming, MD  loratadine (CLARITIN) 10 MG tablet Take 10 mg by mouth daily.    [provider]  montelukast (SINGULAIR) 10 MG tablet Take 10 mg by mouth at bedtime as needed (allergies).     [provider]  naproxen (NAPROSYN) 500 MG tablet Take 1 tablet (500 mg total) by mouth 2 (two) times daily. Patient not taking: Reported on 07/03/2022 04/25/22   Regan Lemming, MD  Vitamin D, Ergocalciferol, (DRISDOL) 1.25 MG (50000 UT)  CAPS capsule Take 50,000 Units by mouth every 7 (seven) days.    [provider]      Allergies    Aspirin and Penicillins    Review of Systems   Review of Systems  Cardiovascular:  Positive for chest pain.  All other systems reviewed and are negative.   Physical Exam Updated Vital Signs BP 133/67   Pulse 61   Temp 98 F (36.7 C)   Resp 18   Ht '5\' 6"'$  (1.676 m)   Wt 73.9 kg   LMP 01/18/2012   SpO2 100%   BMI 26.31 kg/m  Physical Exam Vitals and nursing note reviewed.  Constitutional:      Appearance: She is  well-developed.  HENT:     Head: Normocephalic and atraumatic.  Cardiovascular:     Rate and Rhythm: Normal rate and regular rhythm.     Heart sounds: No murmur heard. Pulmonary:     Effort: Pulmonary effort is normal. No respiratory distress.     Breath sounds: Normal breath sounds.  Chest:     Chest wall: No tenderness.  Abdominal:     Palpations: Abdomen is soft.     Tenderness: There is no abdominal tenderness. There is no guarding or rebound.  Musculoskeletal:        General: No tenderness.     Comments: 2+ radial and DP pulses bilaterally  Skin:    General: Skin is warm and dry.  Neurological:     Mental Status: She is alert and oriented to person, place, and time.     Comments: 5/5 strength in all four extremities with sensation to light touch intact in all four extremities.  Visual fields grossly intact.  No asymmetry of facial movements.    Psychiatric:        Behavior: Behavior normal.     ED Results / Procedures / Treatments   Labs (all labs ordered are listed, but only abnormal results are displayed) Labs Reviewed  BASIC METABOLIC PANEL - Abnormal; Notable for the following components:      Result Value   Glucose, Bld 102 (*)    All other components within normal limits  CBC  HEPATIC FUNCTION PANEL  LIPASE, BLOOD  I-STAT BETA HCG BLOOD, ED ([facility], [facility], [facility] ONLY)  TROPONIN I (HIGH SENSITIVITY)  TROPONIN I (HIGH SENSITIVITY)    EKG EKG Interpretation  Date/Time:  Monday August 22 2022 00:02:36 EDT Ventricular Rate:  71 PR Interval:  135 QRS Duration: 130 QT Interval:  398 QTC Calculation: 433 R Axis:   49 Text Interpretation: Sinus rhythm Supraventricular bigeminy Nonspecific intraventricular conduction delay Borderline T abnormalities, inferior leads Confirmed by Quintella Reichert [id number]) on 08/22/2022 5:39:43 AM  Radiology CT Head Wo Contrast  Result Date: 08/22/2022 CLINICAL DATA:  56 year old female with history of sudden onset of headache. Left arm  tingling. EXAM: CT HEAD WITHOUT CONTRAST TECHNIQUE: Contiguous axial images were obtained from the base of the skull through the vertex without intravenous contrast. RADIATION DOSE REDUCTION: This exam was performed according to the departmental dose-optimization program which includes automated exposure control, adjustment of the mA and/or kV according to patient size and/or use of iterative reconstruction technique. COMPARISON:  Head CT 08/02/2018. FINDINGS: Brain: No evidence of acute infarction, hemorrhage, hydrocephalus, extra-axial collection or mass lesion/mass effect. Vascular: No hyperdense vessel or unexpected calcification. Skull: Normal. Negative for fracture or focal lesion. Sinuses/Orbits: No acute finding. Other: None. IMPRESSION: 1. No acute intracranial abnormalities. The appearance of the brain is normal.  Electronically Signed   By: Vinnie Langton M.D.   On: 08/22/2022 06:45   DG Chest 2 View  Result Date: 08/22/2022 CLINICAL DATA:  Chest pain EXAM: CHEST - 2 VIEW COMPARISON:  07/03/2022 FINDINGS: The heart size and mediastinal contours are within normal limits. Both lungs are clear. The visualized skeletal structures are unremarkable. IMPRESSION: Normal study Electronically Signed   By: Rolm Baptise M.D.   On: 08/22/2022 00:31    Procedures Procedures    Medications Ordered in ED Medications  oxyCODONE (Oxy IR/ROXICODONE) immediate release tablet 5 mg (has no administration in time range)  metoCLOPramide (REGLAN) injection 10 mg (10 mg Intravenous Given 08/22/22 0647)  acetaminophen (TYLENOL) tablet 650 mg (650 mg Oral Given 08/22/22 1941)    ED Course/ Medical Decision Making/ A&P                           Medical Decision Making Amount and/or Complexity of Data Reviewed Labs: ordered. Radiology: ordered.  Risk OTC drugs. Prescription drug management.   Patient here for evaluation of chest pain and headache.  She is nontoxic-appearing on evaluation with no focal  neurologic deficits.  EKG is abnormal but similar when compared to priors.  She has been seen by cardiology for same EKG changes with plan for outpatient echo.  Initial troponin is negative.  CT head is negative for acute abnormality.  Current clinical picture is not consistent with CVA, hypertensive urgency, subarachnoid hemorrhage, meningitis, acute aortic dissection, PE.  Care transferred pending repeat troponin.        Final Clinical Impression(s) / ED Diagnoses Final diagnoses:  Bad headache  Atypical chest pain    Rx / DC Orders ED Discharge Orders     None         Quintella Reichert, MD 08/22/22 [phone]

## 2022-09-11 ENCOUNTER — Ambulatory Visit
Admission: EM | Admit: 2022-09-11 | Discharge: 2022-09-11 | Disposition: A | Discharge status: Home / Self Care | Payer: Managed Care, Other (non HMO) | Attending: Emergency Medicine | Admitting: Emergency Medicine

## 2022-09-11 ENCOUNTER — Encounter (HOSPITAL_BASED_OUTPATIENT_CLINIC_OR_DEPARTMENT_OTHER): Payer: Self-pay

## 2022-09-11 ENCOUNTER — Emergency Department (HOSPITAL_BASED_OUTPATIENT_CLINIC_OR_DEPARTMENT_OTHER): Payer: Managed Care, Other (non HMO)

## 2022-09-11 ENCOUNTER — Other Ambulatory Visit: Payer: Self-pay

## 2022-09-11 ENCOUNTER — Encounter: Payer: Self-pay | Admitting: Emergency Medicine

## 2022-09-11 ENCOUNTER — Emergency Department (HOSPITAL_BASED_OUTPATIENT_CLINIC_OR_DEPARTMENT_OTHER)
Admission: EM | Admit: 2022-09-11 | Discharge: 2022-09-11 | Disposition: A | Discharge status: Home / Self Care | Payer: Managed Care, Other (non HMO) | Attending: Emergency Medicine | Admitting: Emergency Medicine

## 2022-09-11 DIAGNOSIS — R2241 Localized swelling, mass and lump, right lower limb: Secondary | ICD-10-CM

## 2022-09-11 DIAGNOSIS — L03115 Cellulitis of right lower limb: Secondary | ICD-10-CM | POA: Diagnosis not present

## 2022-09-11 DIAGNOSIS — M7989 Other specified soft tissue disorders: Secondary | ICD-10-CM | POA: Diagnosis present

## 2022-09-11 MED ORDER — ACETAMINOPHEN 325 MG PO TABS: 975.0000 mg | ORAL_TABLET | Freq: Once | ORAL | Status: DC

## 2022-09-11 MED ORDER — SULFAMETHOXAZOLE-TRIMETHOPRIM 800-160 MG PO TABS: 1.0000 | ORAL_TABLET | Freq: Two times a day (BID) | ORAL | 0 refills | Status: AC

## 2022-09-11 MED ORDER — SULFAMETHOXAZOLE-TRIMETHOPRIM 800-160 MG PO TABS: 1.0000 | ORAL_TABLET | Freq: Two times a day (BID) | ORAL | 0 refills | Status: DC

## 2022-09-11 NOTE — Discharge Instructions (Addendum)
Follow-up with your primary physician within the next week.  If you have any other concerns, including worsening symptoms, chest pain, shortness of breath, fever/chills, please not hesitate to call 911 or return to the emergency department.

## 2022-09-11 NOTE — ED Provider Notes (Addendum)
Patient reports acute swelling of her right lower extremity a few hours prior to arrival.  Patient states the area is warm, swollen and tender to palpation.  Patient denies acute injury to her right lower extremity.  Blood pressure elevated on arrival, vital signs otherwise normal.  Patient mentating well.  Patient accompanied by significant other.  Patient redirected to emergency room for evaluation to rule out DVT.  Significant other agrees to drive her there now.   Lynden Oxford Scales, PA-C 09/11/22 1322    Lynden Oxford Scales, PA-C 09/11/22 1322

## 2022-09-11 NOTE — ED Provider Notes (Signed)
[facility] EMERGENC[facility] DEPT Provider Note   CSN: [id number] Arrival date & time: 09/11/22  1348     History  Chief Complaint  Patient presents with   Leg Swelling    [name] is a 56 y.o. female with hyperlipidemia, anxiety, GERD, obesity, migraine presents with leg swelling.   Patient reports acute onset swelling, warmth, and pain of her right lower extremity that started last night.  Went to urgent care and was sent to the emergency department for imaging.  Denies any known trauma, no fever/chills, no wounds.  Patient does not take any hormones, no recent surgery, has not been recently hospitalized or had any significant travel. No h/o similar. Since she has been here in the ED, the swelling has resolved but the pain is still present. She started taking a prednisone pill pack this morning for nasal congestion that she was given as well, but the symptoms started prior to the prednisone.  Patient also has a small 5 mm circular dark mole in that area of her leg that has been there for many years and is unchanged, this is in the area of where the swelling was.  Has never seen a dermatologist for this.  HPI     Home Medications Prior to Admission medications   Medication Sig Start Date End Date Taking? Authorizing Provider  sulfamethoxazole-trimethoprim (BACTRIM DS) 800-160 MG tablet Take 1 tablet by mouth 2 (two) times daily for 5 days. 09/11/22 09/16/22 Yes Audley Hose, MD  albuterol (VENTOLIN HFA) 108 (90 Base) MCG/ACT inhaler Inhale 2 puffs into the lungs every 6 (six) hours as needed for shortness of breath or wheezing. 02/08/22   [provider]  atorvastatin (LIPITOR) 40 MG tablet Take 40 mg by mouth daily.    [provider]  cyclobenzaprine (FLEXERIL) 10 MG tablet Take 1 tablet (10 mg total) by mouth 2 (two) times daily as needed for muscle spasms. 04/25/22   Regan Lemming, MD  EPINEPHrine 0.3 mg/0.3 mL IJ SOAJ injection Inject 0.3 mg into  the muscle as needed for anaphylaxis. 02/09/22   [provider]  fexofenadine (ALLEGRA) 60 MG tablet Take 60 mg by mouth daily.    [provider]  fluticasone (FLONASE) 50 MCG/ACT nasal spray Place 1 spray into both nostrils daily.    [provider]  hydrochlorothiazide (HYDRODIURIL) 25 MG tablet Take 1 tablet (25 mg total) by mouth daily. Patient not taking: Reported on 07/03/2022 07/04/19   Molpus, John, MD  lidocaine (LIDODERM) 5 % Place 1 patch onto the skin daily. Remove & Discard patch within 12 hours or as directed by MD Patient not taking: Reported on 07/03/2022 04/25/22   Regan Lemming, MD  loratadine (CLARITIN) 10 MG tablet Take 10 mg by mouth daily.    [provider]  montelukast (SINGULAIR) 10 MG tablet Take 10 mg by mouth at bedtime as needed (allergies).     [provider]  naproxen (NAPROSYN) 500 MG tablet Take 1 tablet (500 mg total) by mouth 2 (two) times daily. Patient not taking: Reported on 07/03/2022 04/25/22   Regan Lemming, MD  Vitamin D, Ergocalciferol, (DRISDOL) 1.25 MG (50000 UT) CAPS capsule Take 50,000 Units by mouth every 7 (seven) days.    [provider]      Allergies    Aspirin and Penicillins    Review of Systems   Review of Systems Review of systems negative for chest pain, shortness of breath, cough, abdominal pain, nausea vomiting diarrhea constipation.Marland Kitchen  A 10 point review of systems was performed and is negative unless otherwise reported in HPI.  Physical Exam Updated Vital Signs BP (!) 147/78 (BP Location: Right Arm)   Pulse 65   Temp 98.3 F (36.8 C) (Oral)   Resp 16   Ht '5\' 6"'$  (1.676 m)   Wt 73.9 kg   LMP 01/18/2012   SpO2 98%   BMI 26.30 kg/m  Physical Exam General: Normal appearing female, lying in bed.  HEENT: PERRLA, Sclera anicteric, MMM, trachea midline. Cardiology: RRR, no murmurs/rubs/gallops. BL radial and DP pulses equal bilaterally.  Resp: Normal respiratory rate and effort.  CTAB, no wheezes, rhonchi, crackles.  Abd: Soft, non-tender, non-distended. No rebound tenderness or guarding.  GU: Deferred. MSK: 5 x 5 cm area of tenderness on the medial lower leg, no skin changes/erythema/warmth.  No peripheral edema or signs of trauma. Extremities without deformity.  Bevelyn Buckles' sign negative.  No cyanosis or clubbing.  Normal range of motion of her ankle and knee, patient is able to ambulate without difficulty. Skin: warm, dry. No rashes or lesions. Neuro: A&Ox4, CNs II-XII grossly intact. MAEs. Sensation grossly intact.  Psych: Normal mood and affect.   ED Results / Procedures / Treatments   Labs (all labs ordered are listed, but only abnormal results are displayed) Labs Reviewed - No data to display  EKG None  Radiology US Venous Img Lower Unilateral Right  Result Date: 09/11/2022 CLINICAL DATA:  Pain and swelling EXAM: Right LOWER EXTREMITY VENOUS DOPPLER ULTRASOUND TECHNIQUE: Gray-scale sonography with compression, as well as color and duplex ultrasound, were performed to evaluate the deep venous system(s) from the level of the common femoral vein through the popliteal and proximal calf veins. COMPARISON:  None Available. FINDINGS: VENOUS Normal compressibility of the common femoral, superficial femoral, and popliteal veins, as well as the visualized calf veins. Visualized portions of profunda femoral vein and great saphenous vein unremarkable. No filling defects to suggest DVT on grayscale or color Doppler imaging. Doppler waveforms show normal direction of venous flow, normal respiratory plasticity and response to augmentation. Limited views of the contralateral common femoral vein are unremarkable. OTHER None. Limitations: none IMPRESSION: There is no evidence of deep venous thrombosis in right lower extremity. Electronically Signed   By: Elmer Picker M.D.   On: 09/11/2022 14:52    Procedures Procedures    Medications Ordered in ED Medications - No data to  display  ED Course/ Medical Decision Making/ A&P                          Medical Decision Making   Patient is hemodynamically stable, very well-appearing, afebrile, presents with an area of swelling/warmth/tenderness to her lower leg without any trauma, no DVT risk factors, Bevelyn Buckles' sign negative.  Consider DVT versus cellulitis.  No fluctuance to indicate abscess.  No joint involvement, no trauma to suggest septic arthritis or fracture, no x-ray necessary at this time.  Patient is overall very well-appearing and hemodynamically stable, low concern for serious bacterial infection.  Patient with negative ultrasound DVT of the right lower extremity. Patient's symptoms have improved but is the area is still painful to touch.  Possible that the erythema is difficult to see on patient's dark skin, and since the area is still tender, will give a course of bactrim (as patient has anaphylactic penicillin allergy) and instructed follow-up with primary care physician.  Patient states she can follow-up this week with her PCP, encouraged to also  see dermatology in the future about this dark mole.  Discharge with discharge structures and return precautions.  All questions answered to patient satisfaction.  Dispo: DC            Final Clinical Impression(s) / ED Diagnoses Final diagnoses:  Cellulitis of right lower extremity    Rx / DC Orders ED Discharge Orders          Ordered    sulfamethoxazole-trimethoprim (BACTRIM DS) 800-160 MG tablet  2 times daily        09/11/22 1554             This note was created using dictation software, which may contain spelling or grammatical errors.    Audley Hose, MD 09/11/22 [phone]

## 2022-09-11 NOTE — ED Notes (Signed)
Patient is being discharged from the Urgent Care and sent to the Emergency Department via POV . Per LM, patient is in need of higher level of care due to leg swelling/DVT r/o. Patient is aware and verbalizes understanding of plan of care.  Vitals:   09/11/22 1247  BP: (!) 162/85  Pulse: 69  Resp: 18  Temp: 97.9 F (36.6 C)  SpO2: 97%

## 2022-09-11 NOTE — ED Triage Notes (Signed)
Patient here POV from Home.  Endorses Area of Localized Swelling to Right Medial Leg that was noted today.  Sent by UC for Imaging. No Known Trauma. No Fevers.   NAD Noted during Triage. A&Ox4. Gcs 15. Ambulatory

## 2022-09-11 NOTE — ED Triage Notes (Signed)
Pt here for swollen area to right lower leg noticed this am with some pain; denies injury

## 2022-10-13 ENCOUNTER — Ambulatory Visit
Admission: EM | Admit: 2022-10-13 | Discharge: 2022-10-13 | Disposition: A | Discharge status: Home / Self Care | Payer: Managed Care, Other (non HMO) | Attending: Physician Assistant | Admitting: Physician Assistant

## 2022-10-13 DIAGNOSIS — L089 Local infection of the skin and subcutaneous tissue, unspecified: Secondary | ICD-10-CM | POA: Diagnosis not present

## 2022-10-13 MED ORDER — DOXYCYCLINE HYCLATE 100 MG PO CAPS: 100.0000 mg | ORAL_CAPSULE | Freq: Two times a day (BID) | ORAL | 0 refills | Status: DC

## 2022-10-13 NOTE — ED Triage Notes (Signed)
Pt presents with insect bite to left calf area that is very itchy,irritated,  painful, and warm to touch since yesterday.

## 2022-10-13 NOTE — ED Provider Notes (Signed)
[facility]    CSN: [id number] Arrival date & time: 10/13/22  1221      History   Chief Complaint Chief Complaint  Patient presents with   Insect Bite    HPI [name] is a 56 y.o. female.   Patient here today for evaluation of possible insect bite to her left calf that she noticed yesterday.  She reports that the area is very itchy and painful.  She also notes there is some warmth to touch.  She has not had any fever.  She denies any nausea or vomiting.  The history is provided by the patient.    Past Medical History:  Diagnosis Date   Chest pain 2008   neg work up- dx'd with stress   Hyperlipidemia    No pertinent past medical history     Patient Active Problem List   Diagnosis Date Noted   Premature atrial complexes 08/19/2022   Anxiety 03/20/2019   Ingrowing nail 03/27/2018   Gastroesophageal reflux disease without esophagitis 03/15/2018   Class 1 obesity due to excess calories without serious comorbidity with body mass index (BMI) of 30.0 to 30.9 in adult 03/15/2018   Allergic rhinitis 01/12/2016   Migraine 01/12/2016   HYPERLIPIDEMIA 01/05/2011   EAR PAIN, LEFT 03/09/2009   ELBOW PAIN 03/09/2009    Past Surgical History:  Procedure Laterality Date   BREAST SURGERY     CESAREAN SECTION     x 2   HAMMER TOE SURGERY     LAPAROSCOPIC ASSISTED VAGINAL HYSTERECTOMY  02/09/2012   Procedure: LAPAROSCOPIC ASSISTED VAGINAL HYSTERECTOMY;  Surgeon: Marvene Staff, MD;  Location: [facility];  Service: Gynecology;  Laterality: N/A;  Requests 2 hrs.  Also wants a 3 way foley.    OB History     Gravida  2   Para  2   Term      Preterm      AB      Living         SAB      IAB      Ectopic      Multiple      Live Births               Home Medications    Prior to Admission medications   Medication Sig Start Date End Date Taking? Authorizing Provider  doxycycline (VIBRAMYCIN) 100 MG capsule Take 1 capsule (100 mg total)  by mouth 2 (two) times daily. 10/13/22  Yes Francene Finders, PA-C  albuterol (VENTOLIN HFA) 108 (90 Base) MCG/ACT inhaler Inhale 2 puffs into the lungs every 6 (six) hours as needed for shortness of breath or wheezing. 02/08/22   [provider]  atorvastatin (LIPITOR) 40 MG tablet Take 40 mg by mouth daily.    [provider]  cyclobenzaprine (FLEXERIL) 10 MG tablet Take 1 tablet (10 mg total) by mouth 2 (two) times daily as needed for muscle spasms. 04/25/22   Regan Lemming, MD  EPINEPHrine 0.3 mg/0.3 mL IJ SOAJ injection Inject 0.3 mg into the muscle as needed for anaphylaxis. 02/09/22   [provider]  fexofenadine (ALLEGRA) 60 MG tablet Take 60 mg by mouth daily.    [provider]  fluticasone (FLONASE) 50 MCG/ACT nasal spray Place 1 spray into both nostrils daily.    [provider]  hydrochlorothiazide (HYDRODIURIL) 25 MG tablet Take 1 tablet (25 mg total) by mouth daily. Patient not taking: Reported on 07/03/2022 07/04/19   Molpus, Jenny Reichmann,  MD  lidocaine (LIDODERM) 5 % Place 1 patch onto the skin daily. Remove & Discard patch within 12 hours or as directed by MD Patient not taking: Reported on 07/03/2022 04/25/22   Regan Lemming, MD  loratadine (CLARITIN) 10 MG tablet Take 10 mg by mouth daily.    [provider]  montelukast (SINGULAIR) 10 MG tablet Take 10 mg by mouth at bedtime as needed (allergies).     [provider]  naproxen (NAPROSYN) 500 MG tablet Take 1 tablet (500 mg total) by mouth 2 (two) times daily. Patient not taking: Reported on 07/03/2022 04/25/22   Regan Lemming, MD  Vitamin D, Ergocalciferol, (DRISDOL) 1.25 MG (50000 UT) CAPS capsule Take 50,000 Units by mouth every 7 (seven) days.    [provider]    Family History Family History  Family history unknown: Yes    Social History Social History   Tobacco Use   Smoking status: Every Day    Packs/day: 0.25    Years: 15.00    Total pack years: 3.75     Types: Cigarettes   Smokeless tobacco: Never  Substance Use Topics   Alcohol use: Yes    Comment: occasionally   Drug use: No     Allergies   Aspirin and Penicillins   Review of Systems Review of Systems  Constitutional:  Negative for chills and fever.  Eyes:  Negative for discharge and redness.  Gastrointestinal:  Negative for abdominal pain, nausea and vomiting.  Skin:  Positive for color change. Negative for wound.     Physical Exam Triage Vital Signs ED Triage Vitals  Enc Vitals Group     BP 10/13/22 1347 (!) 146/87     Pulse Rate 10/13/22 1347 66     Resp 10/13/22 1347 18     Temp 10/13/22 1347 98 F (36.7 C)     Temp Source 10/13/22 1347 Oral     SpO2 10/13/22 1347 97 %     Weight --      Height --      Head Circumference --      Peak Flow --      Pain Score 10/13/22 1346 5     Pain Loc --      Pain Edu? --      Excl. in [place]? --    No data found.  Updated Vital Signs BP (!) 146/87 (BP Location: Left Arm)   Pulse 66   Temp 98 F (36.7 C) (Oral)   Resp 18   LMP 01/18/2012   SpO2 97%     Physical Exam Vitals and nursing note reviewed.  Constitutional:      General: She is not in acute distress.    Appearance: Normal appearance. She is not ill-appearing.  HENT:     Head: Normocephalic and atraumatic.  Eyes:     Conjunctiva/sclera: Conjunctivae normal.  Cardiovascular:     Rate and Rhythm: Normal rate.  Pulmonary:     Effort: Pulmonary effort is normal.  Skin:    Comments: Approx 3 papules noted to left posterior lower leg with surrounding erythema, induration, TTP  Neurological:     Mental Status: She is alert.  Psychiatric:        Mood and Affect: Mood normal.        Behavior: Behavior normal.        Thought Content: Thought content normal.      UC Treatments / Results  Labs (all labs ordered are listed, but only  abnormal results are displayed) Labs Reviewed - No data to display  EKG   Radiology No results  found.  Procedures Procedures (including critical care time)  Medications Ordered in UC Medications - No data to display  Initial Impression / Assessment and Plan / UC Course  I have reviewed the triage vital signs and the nursing notes.  Pertinent labs & imaging results that were available during my care of the patient were reviewed by me and considered in my medical decision making (see chart for details).    Doxycycline prescribed to cover possible skin/ soft tissue infection. Recommend follow up if no gradual improvement or with any further concerns.   Final Clinical Impressions(s) / UC Diagnoses   Final diagnoses:  Skin infection   Discharge Instructions   None    ED Prescriptions     Medication Sig Dispense Auth. Provider   doxycycline (VIBRAMYCIN) 100 MG capsule Take 1 capsule (100 mg total) by mouth 2 (two) times daily. 20 capsule Francene Finders, PA-C      PDMP not reviewed this encounter.   Francene Finders, PA-C 10/13/22 1506

## 2023-04-12 ENCOUNTER — Other Ambulatory Visit: Payer: Self-pay

## 2023-04-12 ENCOUNTER — Emergency Department (HOSPITAL_COMMUNITY): Payer: Managed Care, Other (non HMO)

## 2023-04-12 ENCOUNTER — Emergency Department (HOSPITAL_COMMUNITY)
Admission: EM | Admit: 2023-04-12 | Discharge: 2023-04-12 | Disposition: A | Discharge status: Home / Self Care | Payer: Managed Care, Other (non HMO) | Attending: Emergency Medicine | Admitting: Emergency Medicine

## 2023-04-12 ENCOUNTER — Encounter (HOSPITAL_COMMUNITY): Payer: Self-pay

## 2023-04-12 DIAGNOSIS — X501XXA Overexertion from prolonged static or awkward postures, initial encounter: Secondary | ICD-10-CM | POA: Diagnosis not present

## 2023-04-12 DIAGNOSIS — M79671 Pain in right foot: Secondary | ICD-10-CM | POA: Diagnosis present

## 2023-04-12 NOTE — Discharge Instructions (Signed)
Try to keep off of your foot is much as possible for the next 7 days.  Try to keep it elevated up on the couch and apply ice or cooling packs for 10 minutes at a time, on and off for the next 3 days.  You can take over-the-counter Tylenol and ibuprofen as needed for pain.  You should be able to walk normally with nearly all of your weight in 7 days.  If you cannot do this, or still of significant limping or pain, please contact your doctors office about repeat x-rays.  It is possible that there are small hairline fractures that are not seen on initial x-rays.

## 2023-04-12 NOTE — ED Triage Notes (Addendum)
C/o right ankle and foot pain after missing step last night.  Pt ambulatory to triage.

## 2023-04-12 NOTE — ED Provider Notes (Signed)
[place] EMERGENCY DEPARTMENT AT [facility] Provider Note   CSN: [id number] Arrival date & time: 04/12/23  1740     History  Chief Complaint  Patient presents with   Foot Pain    [name] is a 57 y.o. female presented to ED complaining of pain in her right foot after rolling her foot and missing a step yesterday.  She reports pain along the lateral aspect of her right foot, worse with weightbearing.  She is able to bear weight but has some tenderness with this.  HPI     Home Medications Prior to Admission medications   Medication Sig Start Date End Date Taking? Authorizing Provider  albuterol (VENTOLIN HFA) 108 (90 Base) MCG/ACT inhaler Inhale 2 puffs into the lungs every 6 (six) hours as needed for shortness of breath or wheezing. 02/08/22   [provider]  atorvastatin (LIPITOR) 40 MG tablet Take 40 mg by mouth daily.    [provider]  cyclobenzaprine (FLEXERIL) 10 MG tablet Take 1 tablet (10 mg total) by mouth 2 (two) times daily as needed for muscle spasms. 04/25/22   Ernie Avena, MD  doxycycline (VIBRAMYCIN) 100 MG capsule Take 1 capsule (100 mg total) by mouth 2 (two) times daily. 10/13/22   Tomi Bamberger, PA-C  EPINEPHrine 0.3 mg/0.3 mL IJ SOAJ injection Inject 0.3 mg into the muscle as needed for anaphylaxis. 02/09/22   [provider]  fexofenadine (ALLEGRA) 60 MG tablet Take 60 mg by mouth daily.    [provider]  fluticasone (FLONASE) 50 MCG/ACT nasal spray Place 1 spray into both nostrils daily.    [provider]  hydrochlorothiazide (HYDRODIURIL) 25 MG tablet Take 1 tablet (25 mg total) by mouth daily. Patient not taking: Reported on 07/03/2022 07/04/19   Molpus, John, MD  lidocaine (LIDODERM) 5 % Place 1 patch onto the skin daily. Remove & Discard patch within 12 hours or as directed by MD Patient not taking: Reported on 07/03/2022 04/25/22   Ernie Avena, MD  loratadine (CLARITIN) 10 MG tablet Take  10 mg by mouth daily.    [provider]  montelukast (SINGULAIR) 10 MG tablet Take 10 mg by mouth at bedtime as needed (allergies).     [provider]  naproxen (NAPROSYN) 500 MG tablet Take 1 tablet (500 mg total) by mouth 2 (two) times daily. Patient not taking: Reported on 07/03/2022 04/25/22   Ernie Avena, MD  Vitamin D, Ergocalciferol, (DRISDOL) 1.25 MG (50000 UT) CAPS capsule Take 50,000 Units by mouth every 7 (seven) days.    [provider]      Allergies    Aspirin and Penicillins    Review of Systems   Review of Systems  Physical Exam Updated Vital Signs BP (!) 153/83 (BP Location: Left Arm)   Pulse 84   Temp 98.2 F (36.8 C) (Oral)   Resp 17   Wt 73 kg   LMP 01/18/2012   SpO2 93%   BMI 25.98 kg/m  Physical Exam Constitutional:      General: She is not in acute distress. HENT:     Head: Normocephalic and atraumatic.  Eyes:     Conjunctiva/sclera: Conjunctivae normal.     Pupils: Pupils are equal, round, and reactive to light.  Cardiovascular:     Rate and Rhythm: Normal rate and regular rhythm.     Pulses: Normal pulses.  Pulmonary:     Effort: Pulmonary effort is normal. No respiratory distress.  Musculoskeletal:  Comments: Mild swelling with tenderness along the anterior talofibular ligament.  Also along the medial aspect of the right foot, no midfoot tenderness, no tenderness to the base of the fifth metatarsal.  Skin:    General: Skin is warm and dry.  Neurological:     General: No focal deficit present.     Mental Status: She is alert. Mental status is at baseline.  Psychiatric:        Mood and Affect: Mood normal.        Behavior: Behavior normal.     ED Results / Procedures / Treatments   Labs (all labs ordered are listed, but only abnormal results are displayed) Labs Reviewed - No data to display  EKG None  Radiology DG Ankle Complete Right  Result Date: 04/12/2023 CLINICAL DATA:  Larey Seat down steps last  night. Lateral foot and medial ankle pain. Patient reports prior right ankle surgery after a fracture as a child. EXAM: RIGHT ANKLE - COMPLETE 3+ VIEW; RIGHT FOOT COMPLETE - 3+ VIEW COMPARISON:  Right tibia and fibula, ankle, and foot radiographs 07/21/2020 FINDINGS: Right ankle: The ankle mortise is symmetric and intact. There is a 6 mm ossicle just distal to the fibula, unchanged from prior and likely the sequela of remote trauma. No acute fracture. Right foot: Minimal great toe metatarsophalangeal lateral osteophytosis without significant joint space narrowing, unchanged. The second through fourth PIP joints are again fused. Mild dorsal medial great toe interphalangeal joint space narrowing appears increased from prior. Associated minimal subchondral sclerosis and peripheral osteophytosis best seen on oblique view. No acute fracture or dislocation. IMPRESSION: 1. No acute fracture of the right ankle or foot. 2. 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 3. 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 Electronically Signed   By: Neita Garnet M.D.   On: 04/12/2023 18:25   DG Foot Complete Right  Result Date: 04/12/2023 CLINICAL DATA:  Larey Seat down steps last night. Lateral foot and medial ankle pain. Patient reports prior right ankle surgery after a fracture as a child. EXAM: RIGHT ANKLE - COMPLETE 3+ VIEW; RIGHT FOOT COMPLETE - 3+ VIEW COMPARISON:  Right tibia and fibula, ankle, and foot radiographs 07/21/2020 FINDINGS: Right ankle: The ankle mortise is symmetric and intact. There is a 6 mm ossicle just distal to the fibula, unchanged from prior and likely the sequela of remote trauma. No acute fracture. Right foot: Minimal great toe metatarsophalangeal lateral osteophytosis without significant joint space narrowing, unchanged. The second through fourth PIP joints are again fused. Mild dorsal medial great toe interphalangeal  joint space narrowing appears increased from prior. Associated minimal subchondral sclerosis and peripheral osteophytosis best seen on oblique view. No acute fracture or dislocation. IMPRESSION: 1. No acute fracture of the right ankle or foot. 2. 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 3. 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 Electronically Signed   By: Neita Garnet M.D.   On: 04/12/2023 18:25    Procedures Procedures    Medications Ordered in ED Medications - No data to display  ED Course/ Medical Decision Making/ A&P                             Medical Decision Making  Patient is here with a foot injury.  X-rays were ordered and personally reviewed interpreted, showing no evident fracture.  Low  suspicion for a Jones fracture or midfoot fracture.  She may have a sprain versus an early fracture that is not visualized.  An Ace wrap was provided for support but the patient can be weightbearing as tolerated.  I did recommend as much rest and elevation for this week as possible.  She can do ibuprofen and Tylenol at home.  If she has persistent pain in her foot 7 days from now I advised that she contact her doctor's office for repeat x-rays.  She verbalized understanding        Final Clinical Impression(s) / ED Diagnoses Final diagnoses:  Right foot pain    Rx / DC Orders ED Discharge Orders     None         Elica Almas, Kermit Balo, MD 04/12/23 1836

## 2023-05-16 ENCOUNTER — Emergency Department (HOSPITAL_COMMUNITY)
Admission: EM | Admit: 2023-05-16 | Discharge: 2023-05-16 | Disposition: A | Discharge status: Home / Self Care | Payer: Managed Care, Other (non HMO) | Attending: Emergency Medicine | Admitting: Emergency Medicine

## 2023-05-16 ENCOUNTER — Emergency Department (HOSPITAL_COMMUNITY): Payer: Managed Care, Other (non HMO)

## 2023-05-16 ENCOUNTER — Encounter (HOSPITAL_COMMUNITY): Payer: Self-pay

## 2023-05-16 ENCOUNTER — Other Ambulatory Visit: Payer: Self-pay

## 2023-05-16 DIAGNOSIS — M79604 Pain in right leg: Secondary | ICD-10-CM | POA: Diagnosis present

## 2023-05-16 MED ORDER — DEXAMETHASONE SODIUM PHOSPHATE 10 MG/ML IJ SOLN
10.0000 mg | Freq: Once | INTRAMUSCULAR | Status: AC
  Administered 2023-05-16: 10 mg via INTRAMUSCULAR
  Filled 2023-05-16: qty 1

## 2023-05-16 NOTE — Discharge Instructions (Signed)
You were evaluated today for right leg pain.  Your presentation is consistent with IT band pain and trochanteric bursitis.  Since you are unable to tolerate NSAID medications orally I recommend trying Voltaren gel over the affected area near the hip.  Is important that you follow-up with your orthopedic provider as you may benefit from further evaluation, possible physical therapy, possible other treatments to help get the pain under control.  If you develop any life-threatening symptoms please return to the emergency department.

## 2023-05-16 NOTE — ED Triage Notes (Signed)
Patient is here for evaluation of right hip pain that radiates down the side of her leg to her foot. Denies any injuries.

## 2023-05-16 NOTE — ED Provider Notes (Signed)
[place] EMERGENCY DEPARTMENT AT [facility] Provider Note   CSN: [id number] Arrival date & time: 05/16/23  1121     History  Chief Complaint  Patient presents with   Hip Pain    [name] is a 57 y.o. female.  Patient presents to the emergency department with right sided hip pain for 2 days. Patient describes her pain as "dull and sharp" that radiates down the lateral side of upper leg and anterior lower leg. Patient states sitting and walking worsens her pain and nothing makes it better. Patient denies any past injuries, falls or surgeries to her right hip. Patient denies back pain, urinary changes, numbness/tingling or weakness. Patient denies history of cancer, long-term steroids or IVDU.   HPI     Home Medications Prior to Admission medications   Medication Sig Start Date End Date Taking? Authorizing Provider  albuterol (VENTOLIN HFA) 108 (90 Base) MCG/ACT inhaler Inhale 2 puffs into the lungs every 6 (six) hours as needed for shortness of breath or wheezing. 02/08/22   [provider]  atorvastatin (LIPITOR) 40 MG tablet Take 40 mg by mouth daily.    [provider]  cyclobenzaprine (FLEXERIL) 10 MG tablet Take 1 tablet (10 mg total) by mouth 2 (two) times daily as needed for muscle spasms. 04/25/22   Ernie Avena, MD  doxycycline (VIBRAMYCIN) 100 MG capsule Take 1 capsule (100 mg total) by mouth 2 (two) times daily. 10/13/22   Tomi Bamberger, PA-C  EPINEPHrine 0.3 mg/0.3 mL IJ SOAJ injection Inject 0.3 mg into the muscle as needed for anaphylaxis. 02/09/22   [provider]  fexofenadine (ALLEGRA) 60 MG tablet Take 60 mg by mouth daily.    [provider]  fluticasone (FLONASE) 50 MCG/ACT nasal spray Place 1 spray into both nostrils daily.    [provider]  hydrochlorothiazide (HYDRODIURIL) 25 MG tablet Take 1 tablet (25 mg total) by mouth daily. Patient not taking: Reported on 07/03/2022 07/04/19   Molpus, John, MD   lidocaine (LIDODERM) 5 % Place 1 patch onto the skin daily. Remove & Discard patch within 12 hours or as directed by MD Patient not taking: Reported on 07/03/2022 04/25/22   Ernie Avena, MD  loratadine (CLARITIN) 10 MG tablet Take 10 mg by mouth daily.    [provider]  montelukast (SINGULAIR) 10 MG tablet Take 10 mg by mouth at bedtime as needed (allergies).     [provider]  naproxen (NAPROSYN) 500 MG tablet Take 1 tablet (500 mg total) by mouth 2 (two) times daily. Patient not taking: Reported on 07/03/2022 04/25/22   Ernie Avena, MD  Vitamin D, Ergocalciferol, (DRISDOL) 1.25 MG (50000 UT) CAPS capsule Take 50,000 Units by mouth every 7 (seven) days.    [provider]      Allergies    Aspirin and Penicillins    Review of Systems   Review of Systems  Physical Exam Updated Vital Signs BP (!) 156/75 (BP Location: Left Arm)   Pulse 73   Temp 98.1 F (36.7 C) (Oral)   Resp 18   Ht 5\' 6"  (1.676 m)   Wt 73.5 kg   LMP 01/18/2012   SpO2 100%   BMI 26.15 kg/m  Physical Exam Vitals and nursing note reviewed.  HENT:     Head: Normocephalic and atraumatic.  Eyes:     Pupils: Pupils are equal, round, and reactive to light.  Pulmonary:     Effort: Pulmonary effort is normal. No  respiratory distress.  Musculoskeletal:        General: Tenderness present. No swelling, deformity or signs of injury.     Cervical back: Normal range of motion.     Comments: Patient complains with pain to palpation of the right greater trochanter down the right IT band.  Patient also complains of worsening pain with passive hip flexion.  Abductor strength 4/5 on the right side, 5/5 on the left  Skin:    General: Skin is dry.  Neurological:     Mental Status: She is alert.  Psychiatric:        Speech: Speech normal.        Behavior: Behavior normal.     ED Results / Procedures / Treatments   Labs (all labs ordered are listed, but only abnormal results are  displayed) Labs Reviewed - No data to display  EKG None  Radiology No results found.  Procedures Procedures    Medications Ordered in ED Medications  dexamethasone (DECADRON) injection 10 mg (10 mg Intramuscular Given 05/16/23 1337)    ED Course/ Medical Decision Making/ A&P                             Medical Decision Making Amount and/or Complexity of Data Reviewed Radiology: ordered.  Risk Prescription drug management.   Patient presents to the emergency room with a chief complaint of right-sided hip pain.  Differential diagnosis includes but is not limited to fracture, dislocation, soft tissue injury, and others  Patient endorses having outside orthopedic provider who was performed other procedures for the patient in the past.  I ordered and interpreted imaging including plain films of the right hip.  No fracture or dislocation was noted.  I ordered the patient a shot of Decadron.  I considered Toradol with the patient has an allergy to aspirin.  Upon reassessment she was feeling somewhat better  Patient's presentation is consistent with trochanteric bursitis/IT band pain.  Patient also has weakened abductor muscles on the right side.  Plan to have patient follow-up with orthopedics if she may need PT to strengthen her abductor muscles.  Unfortunately patient is unable to take oral NSAIDs.  I discussed the possibility of using some topical Voltaren and the patient voiced understanding with that plan.  Patient would also potentially benefit from gel electrophoresis depending on orthopedic findings.  Discharge home at this time         Final Clinical Impression(s) / ED Diagnoses Final diagnoses:  Right leg pain    Rx / DC Orders ED Discharge Orders     None         Pamala Duffel 05/16/23 1502    Pricilla Loveless, MD 05/18/23 [phone]

## 2023-07-28 ENCOUNTER — Emergency Department (HOSPITAL_COMMUNITY): Payer: Managed Care, Other (non HMO)

## 2023-07-28 ENCOUNTER — Emergency Department (HOSPITAL_COMMUNITY)
Admission: EM | Admit: 2023-07-28 | Discharge: 2023-07-28 | Disposition: A | Discharge status: Home / Self Care | Payer: Managed Care, Other (non HMO) | Attending: Emergency Medicine | Admitting: Emergency Medicine

## 2023-07-28 ENCOUNTER — Encounter (HOSPITAL_COMMUNITY): Payer: Self-pay

## 2023-07-28 ENCOUNTER — Other Ambulatory Visit: Payer: Self-pay

## 2023-07-28 DIAGNOSIS — R0789 Other chest pain: Secondary | ICD-10-CM

## 2023-07-28 DIAGNOSIS — M5412 Radiculopathy, cervical region: Secondary | ICD-10-CM | POA: Diagnosis not present

## 2023-07-28 DIAGNOSIS — R079 Chest pain, unspecified: Secondary | ICD-10-CM | POA: Diagnosis present

## 2023-07-28 LAB — CBC
HCT: 39.5 % (ref 36.0–46.0)
Hemoglobin: 13 g/dL (ref 12.0–15.0)
MCH: 31.3 pg (ref 26.0–34.0)
MCHC: 32.9 g/dL (ref 30.0–36.0)
MCV: 95 fL (ref 80.0–100.0)
Platelets: 279 10*3/uL (ref 150–400)
RBC: 4.16 MIL/uL (ref 3.87–5.11)
RDW: 14.1 % (ref 11.5–15.5)
WBC: 5 10*3/uL (ref 4.0–10.5)
nRBC: 0 % (ref 0.0–0.2)

## 2023-07-28 LAB — BASIC METABOLIC PANEL
Anion gap: 11 (ref 5–15)
BUN: 12 mg/dL (ref 6–20)
CO2: 23 mmol/L (ref 22–32)
Calcium: 9.1 mg/dL (ref 8.9–10.3)
Chloride: 104 mmol/L (ref 98–111)
Creatinine, Ser: 0.7 mg/dL (ref 0.44–1.00)
GFR, Estimated: >60 mL/min (ref >60)
Glucose, Bld: 177 mg/dL — ABNORMAL HIGH (ref 70–99)
Potassium: 3.1 mmol/L — ABNORMAL LOW (ref 3.5–5.1)
Sodium: 138 mmol/L (ref 135–145)

## 2023-07-28 LAB — TROPONIN I (HIGH SENSITIVITY)
Troponin I (High Sensitivity): 4 ng/L (ref <18)
Troponin I (High Sensitivity): 35 ng/L — ABNORMAL HIGH (ref <18)

## 2023-07-28 MED ORDER — PREDNISONE 10 MG PO TABS: 20.0000 mg | ORAL_TABLET | Freq: Every day | ORAL | 0 refills | Status: DC

## 2023-07-28 MED ORDER — TRAMADOL HCL 50 MG PO TABS: 50.0000 mg | ORAL_TABLET | Freq: Four times a day (QID) | ORAL | 0 refills | Status: DC | PRN

## 2023-07-28 NOTE — ED Notes (Signed)
Patient transported to x-ray. ?

## 2023-07-28 NOTE — Discharge Instructions (Signed)
Follow-up with your family doctor next week for recheck. 

## 2023-07-28 NOTE — ED Provider Notes (Signed)
[place] EMERGENCY DEPARTMENT AT [facility] Provider Note   CSN: [id number] Arrival date & time: 07/28/23  1201     History {Add pertinent medical, surgical, social history, OB history to HPI:1} Chief Complaint  Patient presents with   Chest Pain    [name] is a 57 y.o. female.  Patient complains of some left-sided chest pain and neck pain radiating into her left arm.  Patient is a history of hyperlipidemia   Chest Pain      Home Medications Prior to Admission medications   Medication Sig Start Date End Date Taking? Authorizing Provider  predniSONE (DELTASONE) 10 MG tablet Take 2 tablets (20 mg total) by mouth daily. 07/28/23  Yes Bethann Berkshire, MD  traMADol (ULTRAM) 50 MG tablet Take 1 tablet (50 mg total) by mouth every 6 (six) hours as needed. 07/28/23  Yes Bethann Berkshire, MD  albuterol (VENTOLIN HFA) 108 (90 Base) MCG/ACT inhaler Inhale 2 puffs into the lungs every 6 (six) hours as needed for shortness of breath or wheezing. 02/08/22   [provider]  atorvastatin (LIPITOR) 40 MG tablet Take 40 mg by mouth daily.    [provider]  cyclobenzaprine (FLEXERIL) 10 MG tablet Take 1 tablet (10 mg total) by mouth 2 (two) times daily as needed for muscle spasms. 04/25/22   Ernie Avena, MD  doxycycline (VIBRAMYCIN) 100 MG capsule Take 1 capsule (100 mg total) by mouth 2 (two) times daily. 10/13/22   Tomi Bamberger, PA-C  EPINEPHrine 0.3 mg/0.3 mL IJ SOAJ injection Inject 0.3 mg into the muscle as needed for anaphylaxis. 02/09/22   [provider]  fexofenadine (ALLEGRA) 60 MG tablet Take 60 mg by mouth daily.    [provider]  fluticasone (FLONASE) 50 MCG/ACT nasal spray Place 1 spray into both nostrils daily.    [provider]  hydrochlorothiazide (HYDRODIURIL) 25 MG tablet Take 1 tablet (25 mg total) by mouth daily. Patient not taking: Reported on 07/03/2022 07/04/19   Molpus, John, MD  lidocaine (LIDODERM) 5 %  Place 1 patch onto the skin daily. Remove & Discard patch within 12 hours or as directed by MD Patient not taking: Reported on 07/03/2022 04/25/22   Ernie Avena, MD  loratadine (CLARITIN) 10 MG tablet Take 10 mg by mouth daily.    [provider]  montelukast (SINGULAIR) 10 MG tablet Take 10 mg by mouth at bedtime as needed (allergies).     [provider]  naproxen (NAPROSYN) 500 MG tablet Take 1 tablet (500 mg total) by mouth 2 (two) times daily. Patient not taking: Reported on 07/03/2022 04/25/22   Ernie Avena, MD  Vitamin D, Ergocalciferol, (DRISDOL) 1.25 MG (50000 UT) CAPS capsule Take 50,000 Units by mouth every 7 (seven) days.    [provider]      Allergies    Aspirin and Penicillins    Review of Systems   Review of Systems  Cardiovascular:  Positive for chest pain.    Physical Exam Updated Vital Signs BP 134/79   Pulse 74   Temp 98.3 F (36.8 C) (Oral)   Resp 18   Ht 5\' 6"  (1.676 m)   Wt 73.5 kg   LMP 01/18/2012   SpO2 100%   BMI 26.15 kg/m  Physical Exam  ED Results / Procedures / Treatments   Labs (all labs ordered are listed, but only abnormal results are displayed) Labs Reviewed  BASIC METABOLIC PANEL - Abnormal; Notable for the following components:  Result Value   Potassium 3.1 (*)    Glucose, Bld 177 (*)    All other components within normal limits  CBC  TROPONIN I (HIGH SENSITIVITY)  TROPONIN I (HIGH SENSITIVITY)    EKG EKG Interpretation Date/Time:  Friday July 28 2023 12:09:28 EDT Ventricular Rate:  72 PR Interval:  145 QRS Duration:  79 QT Interval:  365 QTC Calculation: 400 R Axis:   63  Text Interpretation: Sinus rhythm Probable anteroseptal infarct, old Nonspecific T abnormalities, inferior leads Confirmed by Bethann Berkshire [phone]) on 07/28/2023 1:26:40 PM  Radiology DG Cervical Spine Complete  Result Date: 07/28/2023 CLINICAL DATA:  Neck pain without known injury. EXAM: CERVICAL SPINE - COMPLETE 4+  VIEW COMPARISON:  October 11, 2013. FINDINGS: There is no evidence of cervical spine fracture or prevertebral soft tissue swelling. Alignment is normal. Minimal degenerative disc disease is noted at C4-5 and C5-6. Minimal neural foraminal stenosis is noted bilaterally C5-6 secondary to uncovertebral spurring. IMPRESSION: Minimal multilevel degenerative disc disease. Minimal bilateral neural foraminal stenosis at C5-6 secondary to uncovertebral spurring. No acute abnormality seen. Electronically Signed   By: Lupita Raider M.D.   On: 07/28/2023 15:08   DG Chest 2 View  Result Date: 07/28/2023 CLINICAL DATA:  chest pain. EXAM: CHEST - 2 VIEW COMPARISON:  08/22/2022. FINDINGS: Bilateral lung fields are clear. Bilateral costophrenic angles are clear. Normal cardio-mediastinal silhouette. No acute osseous abnormalities. The soft tissues are within normal limits. IMPRESSION: No active cardiopulmonary disease. Electronically Signed   By: Jules Schick M.D.   On: 07/28/2023 13:18    Procedures Procedures  {Document cardiac monitor, telemetry assessment procedure when appropriate:1}  Medications Ordered in ED Medications - No data to display  ED Course/ Medical Decision Making/ A&P   {   Click here for ABCD2, HEART and other calculatorsREFRESH Note before signing :1}                              Medical Decision Making Amount and/or Complexity of Data Reviewed Labs: ordered. Radiology: ordered.  Risk Prescription drug management.   Patient with cervical neuritis and atypical chest pain.  She is sent home on prednisone and Ultram and will follow-up with PCP  {Document critical care time when appropriate:1} {Document review of labs and clinical decision tools ie heart score, Chads2Vasc2 etc:1}  {Document your independent review of radiology images, and any outside records:1} {Document your discussion with family members, caretakers, and with consultants:1} {Document social determinants of  health affecting pt's care:1} {Document your decision making why or why not admission, treatments were needed:1} Final Clinical Impression(s) / ED Diagnoses Final diagnoses:  Atypical chest pain  Cervical radiculopathy    Rx / DC Orders ED Discharge Orders          Ordered    predniSONE (DELTASONE) 10 MG tablet  Daily        07/28/23 1532    traMADol (ULTRAM) 50 MG tablet  Every 6 hours PRN        07/28/23 1532

## 2023-07-28 NOTE — ED Triage Notes (Signed)
Pt reporting chest pain and numbness in the left arm/leg. Pt reports the chest pain and arm numbness has been going on for apprx 2 wks, and the leg numbness since Tuesday. Pt negative for further s/s of stroke. Pt denies any SOB, N/V/D.

## 2023-10-21 ENCOUNTER — Emergency Department (HOSPITAL_COMMUNITY)
Admission: EM | Admit: 2023-10-21 | Discharge: 2023-10-21 | Disposition: A | Discharge status: Home / Self Care | Payer: Self-pay | Attending: Emergency Medicine | Admitting: Emergency Medicine

## 2023-10-21 ENCOUNTER — Emergency Department (HOSPITAL_COMMUNITY): Payer: Managed Care, Other (non HMO)

## 2023-10-21 DIAGNOSIS — Z20822 Contact with and (suspected) exposure to covid-19: Secondary | ICD-10-CM | POA: Insufficient documentation

## 2023-10-21 DIAGNOSIS — F17200 Nicotine dependence, unspecified, uncomplicated: Secondary | ICD-10-CM | POA: Insufficient documentation

## 2023-10-21 DIAGNOSIS — J069 Acute upper respiratory infection, unspecified: Secondary | ICD-10-CM

## 2023-10-21 LAB — SARS CORONAVIRUS 2 BY RT PCR: SARS Coronavirus 2 by RT PCR: NEGATIVE

## 2023-10-21 MED ORDER — IBUPROFEN 200 MG PO TABS
600.0000 mg | ORAL_TABLET | Freq: Once | ORAL | Status: AC
  Administered 2023-10-21: 600 mg via ORAL
  Filled 2023-10-21: qty 3

## 2023-10-21 MED ORDER — BENZONATATE 100 MG PO CAPS: 100.0000 mg | ORAL_CAPSULE | Freq: Three times a day (TID) | ORAL | 0 refills | Status: DC

## 2023-10-21 MED ORDER — BENZONATATE 100 MG PO CAPS: 100.0000 mg | ORAL_CAPSULE | Freq: Three times a day (TID) | ORAL | 0 refills | Status: AC

## 2023-10-21 MED ORDER — ACETAMINOPHEN 500 MG PO TABS
1000.0000 mg | ORAL_TABLET | Freq: Once | ORAL | Status: AC
  Administered 2023-10-21: 1000 mg via ORAL
  Filled 2023-10-21: qty 2

## 2023-10-21 NOTE — Discharge Instructions (Signed)
You were seen in the emergency department for your cough and congestion.  You tested negative for COVID.  You did not require treatment with any antiviral medications can continue symptomatic treatment with Tylenol and Motrin as needed for fevers and bodyaches and both can be taken up to every 6 hours.  I have given you prescription for Tessalon that you can take as needed for cough.  You can try Mucinex or raw honey as needed for your cough and you can use a humidifier or hot steam from the shower to help with your congestion as well as over-the-counter and nasal decongestant sprays.  You can follow-up with your primary doctor in the next few days to have your symptoms rechecked.  You should return to the emergency department for significantly worsening shortness of breath, severe chest pain, repetitive vomiting or if you have any other new or concerning symptoms.

## 2023-10-21 NOTE — ED Provider Notes (Signed)
[place] EMERGENCY DEPARTMENT AT [facility] Provider Note   CSN: [id number] Arrival date & time: 10/21/23  8413     History  Chief Complaint  Patient presents with   Cough    [name] is a 57 y.o. female.  Patient is a 57 year old female with past medical history of tobacco use presenting to the emergency department for cough.  Patient states that she has had a cough productive of mucus for the last week.  She states he has associated congestion and runny nose.  Denies any fevers, nausea or vomiting.  Denies any shortness of breath.  She reports pain across her lower chest when she coughs.  She denies any known sick contacts.  The history is provided by the patient.  Cough      Home Medications Prior to Admission medications   Medication Sig Start Date End Date Taking? Authorizing Provider  benzonatate (TESSALON) 100 MG capsule Take 1 capsule (100 mg total) by mouth every 8 (eight) hours. 10/21/23  Yes Theresia Lo, Benetta Spar K, DO  albuterol (VENTOLIN HFA) 108 (90 Base) MCG/ACT inhaler Inhale 2 puffs into the lungs every 6 (six) hours as needed for shortness of breath or wheezing. 02/08/22   [provider]  atorvastatin (LIPITOR) 40 MG tablet Take 40 mg by mouth daily.    [provider]  cyclobenzaprine (FLEXERIL) 10 MG tablet Take 1 tablet (10 mg total) by mouth 2 (two) times daily as needed for muscle spasms. 04/25/22   Ernie Avena, MD  doxycycline (VIBRAMYCIN) 100 MG capsule Take 1 capsule (100 mg total) by mouth 2 (two) times daily. 10/13/22   Tomi Bamberger, PA-C  EPINEPHrine 0.3 mg/0.3 mL IJ SOAJ injection Inject 0.3 mg into the muscle as needed for anaphylaxis. 02/09/22   [provider]  fexofenadine (ALLEGRA) 60 MG tablet Take 60 mg by mouth daily.    [provider]  fluticasone (FLONASE) 50 MCG/ACT nasal spray Place 1 spray into both nostrils daily.    [provider]  hydrochlorothiazide (HYDRODIURIL) 25  MG tablet Take 1 tablet (25 mg total) by mouth daily. Patient not taking: Reported on 07/03/2022 07/04/19   Molpus, John, MD  lidocaine (LIDODERM) 5 % Place 1 patch onto the skin daily. Remove & Discard patch within 12 hours or as directed by MD Patient not taking: Reported on 07/03/2022 04/25/22   Ernie Avena, MD  loratadine (CLARITIN) 10 MG tablet Take 10 mg by mouth daily.    [provider]  montelukast (SINGULAIR) 10 MG tablet Take 10 mg by mouth at bedtime as needed (allergies).     [provider]  naproxen (NAPROSYN) 500 MG tablet Take 1 tablet (500 mg total) by mouth 2 (two) times daily. Patient not taking: Reported on 07/03/2022 04/25/22   Ernie Avena, MD  predniSONE (DELTASONE) 10 MG tablet Take 2 tablets (20 mg total) by mouth daily. 07/28/23   Bethann Berkshire, MD  traMADol (ULTRAM) 50 MG tablet Take 1 tablet (50 mg total) by mouth every 6 (six) hours as needed. 07/28/23   Bethann Berkshire, MD  Vitamin D, Ergocalciferol, (DRISDOL) 1.25 MG (50000 UT) CAPS capsule Take 50,000 Units by mouth every 7 (seven) days.    [provider]      Allergies    Aspirin and Penicillins    Review of Systems   Review of Systems  Respiratory:  Positive for cough.     Physical Exam Updated Vital Signs BP (!) 142/87 (BP Location: Left Arm)  Pulse 94   Temp 98.6 F (37 C) (Oral)   Resp 18   Ht 5\' 6"  (1.676 m)   Wt 73.5 kg   LMP 01/18/2012   SpO2 96%   BMI 26.15 kg/m  Physical Exam Vitals and nursing note reviewed.  Constitutional:      General: She is not in acute distress.    Appearance: Normal appearance.  HENT:     Head: Normocephalic and atraumatic.     Nose: Congestion present.     Mouth/Throat:     Mouth: Mucous membranes are moist.     Pharynx: Oropharynx is clear.  Eyes:     Extraocular Movements: Extraocular movements intact.     Conjunctiva/sclera: Conjunctivae normal.  Cardiovascular:     Rate and Rhythm: Normal rate and regular rhythm.     Heart  sounds: Normal heart sounds.  Pulmonary:     Effort: Pulmonary effort is normal.     Breath sounds: Normal breath sounds.  Abdominal:     General: Abdomen is flat.     Palpations: Abdomen is soft.     Tenderness: There is no abdominal tenderness.  Musculoskeletal:        General: Normal range of motion.     Cervical back: Normal range of motion.  Skin:    General: Skin is warm and dry.  Neurological:     General: No focal deficit present.     Mental Status: She is alert and oriented to person, place, and time.  Psychiatric:        Mood and Affect: Mood normal.        Behavior: Behavior normal.     ED Results / Procedures / Treatments   Labs (all labs ordered are listed, but only abnormal results are displayed) Labs Reviewed  SARS CORONAVIRUS 2 BY RT PCR    EKG None  Radiology DG Chest 2 View  Result Date: 10/21/2023 CLINICAL DATA:  Productive coughing congestion. EXAM: CHEST - 2 VIEW COMPARISON:  07/28/2023 FINDINGS: The heart size and mediastinal contours are within normal limits. Both lungs are clear. The visualized skeletal structures are unremarkable. IMPRESSION: No active cardiopulmonary disease. Electronically Signed   By: Kennith Center M.D.   On: 10/21/2023 10:01    Procedures Procedures    Medications Ordered in ED Medications  acetaminophen (TYLENOL) tablet 1,000 mg (1,000 mg Oral Given 10/21/23 0937)  ibuprofen (ADVIL) tablet 600 mg (600 mg Oral Given 10/21/23 4696)    ED Course/ Medical Decision Making/ A&P Clinical Course as of 10/21/23 1135  Sat Oct 21, 2023  1017 No acute disease on CXR. Outside window of treatment of COVID and is stable for discharge home with symptomatic management. [VK]    Clinical Course User Index [VK] Rexford Maus, DO                                 Medical Decision Making This patient presents to the ED with chief complaint(s) of cough with pertinent past medical history of tobacco use which further complicates  the presenting complaint. The complaint involves an extensive differential diagnosis and also carries with it a high risk of complications and morbidity.    The differential diagnosis includes viral syndrome, bronchitis, pneumonia, pneumothorax, pulmonary edema, pleural effusion, costochondritis, muscle strain  Additional history obtained: Additional history obtained from N/A Records reviewed N/A  ED Course and Reassessment: Patient's arrival she is hemodynamically stable in no  acute distress satting well on room air.  No obvious focal lung sounds on exam however with her prolonged cough, will have x-ray to evaluate for pulmonary etiology.  She will be swabbed for COVID.  She was given Tylenol and Motrin for her pain likely due to costochondritis or muscle strain and will be closely reassessed.  Independent labs interpretation:  The following labs were independently interpreted: COVID negative  Independent visualization of imaging: - I independently visualized the following imaging with scope of interpretation limited to determining acute life threatening conditions related to emergency care: CXR, which revealed no acute disease  Consultation: - Consulted or discussed management/test interpretation w/ external professional: N/A  Consideration for admission or further workup: Patient has no emergent conditions requiring admission or further work-up at this time and is stable for discharge home with primary care follow-up  Social Determinants of health: n/a    Amount and/or Complexity of Data Reviewed Radiology: ordered.  Risk OTC drugs. Prescription drug management.          Final Clinical Impression(s) / ED Diagnoses Final diagnoses:  Viral URI with cough    Rx / DC Orders ED Discharge Orders          Ordered    benzonatate (TESSALON) 100 MG capsule  Every 8 hours        10/21/23 1134              Rexford Maus, DO 10/21/23 1135

## 2023-10-21 NOTE — ED Triage Notes (Signed)
Patient c/o cough/congestion starting last Sunday. Pain in left axilla and neck area Pain rated 8/10

## 2023-11-02 ENCOUNTER — Encounter (HOSPITAL_COMMUNITY): Payer: Self-pay | Admitting: Emergency Medicine

## 2023-11-02 ENCOUNTER — Emergency Department (HOSPITAL_COMMUNITY)
Admission: EM | Admit: 2023-11-02 | Discharge: 2023-11-02 | Disposition: A | Discharge status: Home / Self Care | Payer: Self-pay | Attending: Emergency Medicine | Admitting: Emergency Medicine

## 2023-11-02 ENCOUNTER — Emergency Department (HOSPITAL_COMMUNITY): Payer: Medicaid Other

## 2023-11-02 DIAGNOSIS — J0100 Acute maxillary sinusitis, unspecified: Secondary | ICD-10-CM | POA: Insufficient documentation

## 2023-11-02 DIAGNOSIS — Z79899 Other long term (current) drug therapy: Secondary | ICD-10-CM | POA: Insufficient documentation

## 2023-11-02 MED ORDER — DOXYCYCLINE HYCLATE 100 MG PO TABS
100.0000 mg | ORAL_TABLET | Freq: Once | ORAL | Status: AC
  Administered 2023-11-02: 100 mg via ORAL
  Filled 2023-11-02: qty 1

## 2023-11-02 MED ORDER — DOXYCYCLINE HYCLATE 100 MG PO CAPS: 100.0000 mg | ORAL_CAPSULE | Freq: Two times a day (BID) | ORAL | 0 refills | Status: AC

## 2023-11-02 MED ORDER — DOXYCYCLINE HYCLATE 100 MG PO CAPS: 100.0000 mg | ORAL_CAPSULE | Freq: Two times a day (BID) | ORAL | 0 refills | Status: DC

## 2023-11-02 NOTE — ED Notes (Signed)
Pt refused to be stick twice.

## 2023-11-02 NOTE — ED Notes (Signed)
Patient refused anymore attempts to collect blood work. Dr Adela Lank made aware

## 2023-11-02 NOTE — Discharge Instructions (Signed)
Please Follow up with your PCP.  Take tylenol 2 pills 4 times a day and motrin 4 pills 3 times a day.  Drink plenty of fluids.  Return for worsening shortness of breath, headache, confusion. Follow up with your family doctor.

## 2023-11-02 NOTE — ED Provider Notes (Signed)
[name] EMERGENCY DEPARTMENT AT [facility] Provider Note   CSN: [id number] Arrival date & time: 11/02/23  6213     History  Chief Complaint  Patient presents with   URI    [name] is a 57 y.o. female.  57 yo F with a cc of cough congestion.  This been going on for about 3 weeks.  She been on a Z-Pak steroids.  Has been seen in the ED recently for this.  Has tried cough medicine without improvement.   URI      Home Medications Prior to Admission medications   Medication Sig Start Date End Date Taking? Authorizing Provider  albuterol (VENTOLIN HFA) 108 (90 Base) MCG/ACT inhaler Inhale 2 puffs into the lungs every 6 (six) hours as needed for shortness of breath or wheezing. 02/08/22   [provider]  atorvastatin (LIPITOR) 40 MG tablet Take 40 mg by mouth daily.    [provider]  benzonatate (TESSALON) 100 MG capsule Take 1 capsule (100 mg total) by mouth every 8 (eight) hours. 10/21/23   Rexford Maus, DO  cyclobenzaprine (FLEXERIL) 10 MG tablet Take 1 tablet (10 mg total) by mouth 2 (two) times daily as needed for muscle spasms. 04/25/22   Ernie Avena, MD  doxycycline (VIBRAMYCIN) 100 MG capsule Take 1 capsule (100 mg total) by mouth 2 (two) times daily. One po bid x 7 days 11/02/23   Melene Plan, DO  EPINEPHrine 0.3 mg/0.3 mL IJ SOAJ injection Inject 0.3 mg into the muscle as needed for anaphylaxis. 02/09/22   [provider]  fexofenadine (ALLEGRA) 60 MG tablet Take 60 mg by mouth daily.    [provider]  fluticasone (FLONASE) 50 MCG/ACT nasal spray Place 1 spray into both nostrils daily.    [provider]  hydrochlorothiazide (HYDRODIURIL) 25 MG tablet Take 1 tablet (25 mg total) by mouth daily. Patient not taking: Reported on 07/03/2022 07/04/19   Molpus, John, MD  lidocaine (LIDODERM) 5 % Place 1 patch onto the skin daily. Remove & Discard patch within 12 hours or as directed by MD Patient not taking:  Reported on 07/03/2022 04/25/22   Ernie Avena, MD  loratadine (CLARITIN) 10 MG tablet Take 10 mg by mouth daily.    [provider]  montelukast (SINGULAIR) 10 MG tablet Take 10 mg by mouth at bedtime as needed (allergies).     [provider]  naproxen (NAPROSYN) 500 MG tablet Take 1 tablet (500 mg total) by mouth 2 (two) times daily. Patient not taking: Reported on 07/03/2022 04/25/22   Ernie Avena, MD  predniSONE (DELTASONE) 10 MG tablet Take 2 tablets (20 mg total) by mouth daily. 07/28/23   Bethann Berkshire, MD  traMADol (ULTRAM) 50 MG tablet Take 1 tablet (50 mg total) by mouth every 6 (six) hours as needed. 07/28/23   Bethann Berkshire, MD  Vitamin D, Ergocalciferol, (DRISDOL) 1.25 MG (50000 UT) CAPS capsule Take 50,000 Units by mouth every 7 (seven) days.    [provider]      Allergies    Aspirin and Penicillins    Review of Systems   Review of Systems  Physical Exam Updated Vital Signs BP (!) 151/90 (BP Location: Left Arm)   Pulse 66   Temp 98.3 F (36.8 C) (Oral)   Resp 17   LMP 01/18/2012   SpO2 94%  Physical Exam Vitals and nursing note reviewed.  Constitutional:      General: She is not in acute  distress.    Appearance: She is well-developed. She is not diaphoretic.  HENT:     Head: Normocephalic and atraumatic.  Eyes:     Pupils: Pupils are equal, round, and reactive to light.  Cardiovascular:     Rate and Rhythm: Normal rate and regular rhythm.     Heart sounds: No murmur heard.    No friction rub. No gallop.  Pulmonary:     Effort: Pulmonary effort is normal.     Breath sounds: No wheezing or rales.  Abdominal:     General: There is no distension.     Palpations: Abdomen is soft.     Tenderness: There is no abdominal tenderness.  Musculoskeletal:        General: No tenderness.     Cervical back: Normal range of motion and neck supple.  Skin:    General: Skin is warm and dry.  Neurological:     Mental Status: She is alert and  oriented to person, place, and time.  Psychiatric:        Behavior: Behavior normal.     ED Results / Procedures / Treatments   Labs (all labs ordered are listed, but only abnormal results are displayed) Labs Reviewed  COMPREHENSIVE METABOLIC PANEL  CBC WITH DIFFERENTIAL/PLATELET  TROPONIN I (HIGH SENSITIVITY)  TROPONIN I (HIGH SENSITIVITY)    EKG EKG Interpretation Date/Time:  Thursday November 02 2023 05:33:00 EST Ventricular Rate:  68 PR Interval:  141 QRS Duration:  88 QT Interval:  370 QTC Calculation: 394 R Axis:   65  Text Interpretation: Sinus rhythm Borderline T abnormalities, diffuse leads st depression in III and aVF with elevation in aVL seen on prior No significant change since last tracing Confirmed by Melene Plan [phone]) on 11/02/2023 8:14:02 AM  Radiology DG Chest 2 View  Result Date: 11/02/2023 CLINICAL DATA:  57 year old female with history of chest pain and cough for the past 3 weeks. EXAM: CHEST - 2 VIEW COMPARISON:  Chest x-ray 10/21/2023. FINDINGS: Lung volumes are normal. No consolidative airspace disease. No pleural effusions. No pneumothorax. No pulmonary nodule or mass noted. Pulmonary vasculature and the cardiomediastinal silhouette are within normal limits. Atherosclerosis in the thoracic aorta. IMPRESSION: 1.  No radiographic evidence of acute cardiopulmonary disease. 2. Aortic atherosclerosis. Electronically Signed   By: Trudie Reed M.D.   On: 11/02/2023 06:32    Procedures Procedures    Medications Ordered in ED Medications  doxycycline (VIBRA-TABS) tablet 100 mg (100 mg Oral Given 11/02/23 6045)    ED Course/ Medical Decision Making/ A&P                                 Medical Decision Making Amount and/or Complexity of Data Reviewed Labs: ordered. Radiology: ordered.  Risk Prescription drug management.   57 yo F with chief complaints of cough and congestion.  This has been going on for about 3 weeks.  Most likely viral but will  cover with antibiotics.  Have her follow-up with her family doctor in the office.  Chest x-ray independently interpreted by me without focal infiltrate or pneumothorax.  9:13 AM:  I have discussed the diagnosis/risks/treatment options with the patient and family.  Evaluation and diagnostic testing in the emergency department does not suggest an emergent condition requiring admission or immediate intervention beyond what has been performed at this time.  They will follow up with PCP. We also discussed returning to the ED  immediately if new or worsening sx occur. We discussed the sx which are most concerning (e.g., sudden worsening pain, fever, inability to tolerate by mouth) that necessitate immediate return. Medications administered to the patient during their visit and any new prescriptions provided to the patient are listed below.  Medications given during this visit Medications  doxycycline (VIBRA-TABS) tablet 100 mg (100 mg Oral Given 11/02/23 0834)     The patient appears reasonably screen and/or stabilized for discharge and I doubt any other medical condition or other [facility] requiring further screening, evaluation, or treatment in the ED at this time prior to discharge.          Final Clinical Impression(s) / ED Diagnoses Final diagnoses:  Acute maxillary sinusitis, recurrence not specified    Rx / DC Orders ED Discharge Orders          Ordered    doxycycline (VIBRAMYCIN) 100 MG capsule  2 times daily,   Status:  Discontinued        11/02/23 0836    doxycycline (VIBRAMYCIN) 100 MG capsule  2 times daily        11/02/23 0838              Melene Plan, DO 11/02/23 [id number]

## 2023-11-02 NOTE — ED Notes (Signed)
Patient left before I was able to obtain vitals

## 2023-11-02 NOTE — ED Triage Notes (Signed)
PT states she has had URI x 3 weeks and not improving with OTC meds. Cough is productive and causes her to have chest pain and rib pain. Denies fevers.

## 2023-12-23 ENCOUNTER — Emergency Department (HOSPITAL_COMMUNITY): Payer: Medicaid Other

## 2023-12-23 ENCOUNTER — Encounter (HOSPITAL_COMMUNITY): Payer: Self-pay

## 2023-12-23 ENCOUNTER — Emergency Department (HOSPITAL_COMMUNITY)
Admission: EM | Admit: 2023-12-23 | Discharge: 2023-12-23 | Disposition: A | Discharge status: Home / Self Care | Payer: Medicaid Other | Attending: Emergency Medicine | Admitting: Emergency Medicine

## 2023-12-23 ENCOUNTER — Other Ambulatory Visit: Payer: Self-pay

## 2023-12-23 DIAGNOSIS — R072 Precordial pain: Secondary | ICD-10-CM | POA: Insufficient documentation

## 2023-12-23 DIAGNOSIS — M546 Pain in thoracic spine: Secondary | ICD-10-CM | POA: Diagnosis not present

## 2023-12-23 DIAGNOSIS — R0789 Other chest pain: Secondary | ICD-10-CM | POA: Diagnosis present

## 2023-12-23 LAB — TROPONIN I (HIGH SENSITIVITY): Troponin I (High Sensitivity): 3 ng/L (ref <18)

## 2023-12-23 LAB — BASIC METABOLIC PANEL
Anion gap: 9 (ref 5–15)
BUN: 19 mg/dL (ref 6–20)
CO2: 24 mmol/L (ref 22–32)
Calcium: 9.7 mg/dL (ref 8.9–10.3)
Chloride: 106 mmol/L (ref 98–111)
Creatinine, Ser: 0.6 mg/dL (ref 0.44–1.00)
GFR, Estimated: >60 mL/min (ref >60)
Glucose, Bld: 87 mg/dL (ref 70–99)
Potassium: 4 mmol/L (ref 3.5–5.1)
Sodium: 139 mmol/L (ref 135–145)

## 2023-12-23 LAB — CBC
HCT: 44.3 % (ref 36.0–46.0)
Hemoglobin: 14.8 g/dL (ref 12.0–15.0)
MCH: 32.5 pg (ref 26.0–34.0)
MCHC: 33.4 g/dL (ref 30.0–36.0)
MCV: 97.4 fL (ref 80.0–100.0)
Platelets: 315 10*3/uL (ref 150–400)
RBC: 4.55 MIL/uL (ref 3.87–5.11)
RDW: 13.9 % (ref 11.5–15.5)
WBC: 6.9 10*3/uL (ref 4.0–10.5)
nRBC: 0 % (ref 0.0–0.2)

## 2023-12-23 MED ORDER — LIDOCAINE 5 % EX PTCH
1.0000 | MEDICATED_PATCH | CUTANEOUS | Status: DC
  Administered 2023-12-23: 1 via TRANSDERMAL
  Filled 2023-12-23: qty 1

## 2023-12-23 MED ORDER — METHOCARBAMOL 500 MG PO TABS: 500.0000 mg | ORAL_TABLET | Freq: Two times a day (BID) | ORAL | 0 refills | Status: AC

## 2023-12-23 MED ORDER — LIDOCAINE 5 % EX PTCH: 1.0000 | MEDICATED_PATCH | CUTANEOUS | 0 refills | Status: DC

## 2023-12-23 MED ORDER — LIDOCAINE 5 % EX PTCH: 1.0000 | MEDICATED_PATCH | CUTANEOUS | 0 refills | Status: AC

## 2023-12-23 MED ORDER — OXYCODONE-ACETAMINOPHEN 5-325 MG PO TABS
1.0000 | ORAL_TABLET | Freq: Once | ORAL | Status: AC
  Administered 2023-12-23: 1 via ORAL
  Filled 2023-12-23: qty 1

## 2023-12-23 MED ORDER — METHOCARBAMOL 500 MG PO TABS
1000.0000 mg | ORAL_TABLET | Freq: Once | ORAL | Status: AC
  Administered 2023-12-23: 1000 mg via ORAL
  Filled 2023-12-23: qty 2

## 2023-12-23 MED ORDER — METHOCARBAMOL 500 MG PO TABS: 500.0000 mg | ORAL_TABLET | Freq: Two times a day (BID) | ORAL | 0 refills | Status: DC

## 2023-12-23 NOTE — ED Provider Notes (Signed)
[name] Provider Note   CSN: [id number] Arrival date & time: 12/23/23  1026     History  Chief Complaint  Patient presents with   Back Pain    [name] is a 58 y.o. female.  Patient with history of hyperlipidemia presents today with complaints of chest and back pain. She states that same began this morning when she was walking out to her car.  She denies any history of similar symptoms previously.  Denies any recent trauma or overuse.  No shortness of breath.  Her pain is in the left side of her chest and wraps around to the left side of her back.  She denies fevers, chills, nausea, or vomiting.  No abdominal pain.  No leg pain or leg swelling.  No cardiac history.  Of note, patient is an everyday smoker.  The history is provided by the patient. No language interpreter was used.  Back Pain Associated symptoms: chest pain        Home Medications Prior to Admission medications   Medication Sig Start Date End Date Taking? Authorizing Provider  albuterol (VENTOLIN HFA) 108 (90 Base) MCG/ACT inhaler Inhale 2 puffs into the lungs every 6 (six) hours as needed for shortness of breath or wheezing. 02/08/22   [provider]  atorvastatin (LIPITOR) 40 MG tablet Take 40 mg by mouth daily.    [provider]  benzonatate (TESSALON) 100 MG capsule Take 1 capsule (100 mg total) by mouth every 8 (eight) hours. 10/21/23   Rexford Maus, DO  cyclobenzaprine (FLEXERIL) 10 MG tablet Take 1 tablet (10 mg total) by mouth 2 (two) times daily as needed for muscle spasms. 04/25/22   Ernie Avena, MD  doxycycline (VIBRAMYCIN) 100 MG capsule Take 1 capsule (100 mg total) by mouth 2 (two) times daily. One po bid x 7 days 11/02/23   Melene Plan, DO  EPINEPHrine 0.3 mg/0.3 mL IJ SOAJ injection Inject 0.3 mg into the muscle as needed for anaphylaxis. 02/09/22   [provider]  fexofenadine (ALLEGRA) 60 MG tablet Take 60 mg  by mouth daily.    [provider]  fluticasone (FLONASE) 50 MCG/ACT nasal spray Place 1 spray into both nostrils daily.    [provider]  hydrochlorothiazide (HYDRODIURIL) 25 MG tablet Take 1 tablet (25 mg total) by mouth daily. Patient not taking: Reported on 07/03/2022 07/04/19   Molpus, John, MD  lidocaine (LIDODERM) 5 % Place 1 patch onto the skin daily. Remove & Discard patch within 12 hours or as directed by MD Patient not taking: Reported on 07/03/2022 04/25/22   Ernie Avena, MD  loratadine (CLARITIN) 10 MG tablet Take 10 mg by mouth daily.    [provider]  montelukast (SINGULAIR) 10 MG tablet Take 10 mg by mouth at bedtime as needed (allergies).     [provider]  naproxen (NAPROSYN) 500 MG tablet Take 1 tablet (500 mg total) by mouth 2 (two) times daily. Patient not taking: Reported on 07/03/2022 04/25/22   Ernie Avena, MD  predniSONE (DELTASONE) 10 MG tablet Take 2 tablets (20 mg total) by mouth daily. 07/28/23   Bethann Berkshire, MD  traMADol (ULTRAM) 50 MG tablet Take 1 tablet (50 mg total) by mouth every 6 (six) hours as needed. 07/28/23   Bethann Berkshire, MD  Vitamin D, Ergocalciferol, (DRISDOL) 1.25 MG (50000 UT) CAPS capsule Take 50,000 Units by mouth every 7 (seven) days.    [provider]  Allergies    Aspirin and Penicillins    Review of Systems   Review of Systems  Cardiovascular:  Positive for chest pain.  Musculoskeletal:  Positive for back pain.  All other systems reviewed and are negative.   Physical Exam Updated Vital Signs BP (!) 165/81 (BP Location: Right Arm)   Pulse 87   Temp 98 F (36.7 C) (Oral)   Resp 18   Ht 5\' 6"  (1.676 m)   Wt 73.5 kg   LMP 01/18/2012   SpO2 100%   BMI 26.15 kg/m  Physical Exam Vitals and nursing note reviewed.  Constitutional:      General: She is not in acute distress.    Appearance: Normal appearance. She is normal weight. She is not ill-appearing, toxic-appearing or  diaphoretic.  HENT:     Head: Normocephalic and atraumatic.  Cardiovascular:     Rate and Rhythm: Normal rate and regular rhythm.     Pulses:          Dorsalis pedis pulses are 2+ on the right side and 2+ on the left side.       Posterior tibial pulses are 2+ on the right side and 2+ on the left side.     Heart sounds: Normal heart sounds.  Pulmonary:     Effort: Pulmonary effort is normal. No respiratory distress.     Breath sounds: Normal breath sounds.  Chest:     Comments: Tenderness noted to palpation throughout the chest. No deformity or overlying skin changes Abdominal:     General: Abdomen is flat.     Palpations: Abdomen is soft.     Tenderness: There is no abdominal tenderness.  Musculoskeletal:        General: Normal range of motion.       Arms:     Cervical back: Normal range of motion and neck supple.     Right lower leg: No edema.     Left lower leg: No edema.     Comments: No midline tenderness to palpation of the cervical, thoracic, or lumbar spine.  Patient observed to be ambulatory with steady gait.  5/5 strength and sensation intact in bilateral upper and lower extremities.  Radial and DP, PT pulses intact and 2+.  Skin:    General: Skin is warm and dry.  Neurological:     General: No focal deficit present.     Mental Status: She is alert and oriented to person, place, and time.  Psychiatric:        Mood and Affect: Mood normal.        Behavior: Behavior normal.     ED Results / Procedures / Treatments   Labs (all labs ordered are listed, but only abnormal results are displayed) Labs Reviewed  BASIC METABOLIC PANEL  CBC  TROPONIN I (HIGH SENSITIVITY)    EKG None  Radiology DG Chest 2 View Result Date: 12/23/2023 CLINICAL DATA:  Chest pain. EXAM: CHEST - 2 VIEW COMPARISON:  11/02/2023. FINDINGS: Bilateral lung fields are clear. Bilateral costophrenic angles are clear. Normal cardio-mediastinal silhouette. No acute osseous abnormalities. The soft  tissues are within normal limits. IMPRESSION: No active cardiopulmonary disease. Electronically Signed   By: Jules Schick M.D.   On: 12/23/2023 13:24    Procedures Procedures    Medications Ordered in ED Medications  lidocaine (LIDODERM) 5 % 1 patch (1 patch Transdermal Patch Applied 12/23/23 1153)  oxyCODONE-acetaminophen (PERCOCET/ROXICET) 5-325 MG per tablet 1 tablet (1 tablet Oral Given 12/23/23 1154)  methocarbamol (ROBAXIN) tablet 1,000 mg (1,000 mg Oral Given 12/23/23 1207)    ED Course/ Medical Decision Making/ A&P                                 Medical Decision Making Amount and/or Complexity of Data Reviewed Labs: ordered. Radiology: ordered.  Risk Prescription drug management.   This patient is a 58 y.o. female who presents to the ED for concern of chest pain, back pain, this involves an extensive number of treatment options, and is a complaint that carries with it a high risk of complications and morbidity. The emergent differential diagnosis prior to evaluation includes, but is not limited to,  AAA, ACS, pericarditis, myocarditis, aortic dissection, PE, pneumothorax, esophageal spasm or rupture, chronic angina, pneumonia, bronchitis, GERD, reflux/PUD, biliary disease, pancreatitis, costochondritis, anxiety  This is not an exhaustive differential.   Past Medical History / Co-morbidities / Social History:  has a past medical history of Chest pain (2008), Hyperlipidemia, and No pertinent past medical history.  Additional history: Chart reviewed. Pertinent results include: no prior listed history of back pain.  Was seen for atypical chest pain in August 2024, had benign workup and was discharged.  Physical Exam: Physical exam performed. The pertinent findings include: generalized tenderness to palpation throughout the chest, and TTP to the left upper back area.  No overlying skin changes or deformity.  Lab Tests: I ordered, and personally interpreted labs.  The  pertinent results include: Troponin WNL, no acute laboratory abnormalities.   Imaging Studies: I ordered imaging studies including CXR. I independently visualized and interpreted imaging which showed NAD. I agree with the radiologist interpretation.   Cardiac Monitoring:  The patient was maintained on a cardiac monitor.  Cardiac monitor showed an underlying rhythm of: no stemi, unchanged from previous. I agree with this interpretation.   Medications: I ordered medication including Percocet, Robaxin, lidocaine patch for pain. Reevaluation of the patient after these medicines showed that the patient improved. I have reviewed the patients home medicines and have made adjustments as needed.  Disposition: After consideration of the diagnostic results and the patients response to treatment, I feel that emergency department workup does not suggest an emergent condition requiring admission or immediate intervention beyond what has been performed at this time. The plan is: Discharge with close outpatient follow-up and return precautions.  Patient's workup is benign, after medication she is feeling improved and ready to go home.  Her pain is reproducible to palpation which suggest potential muscle etiology.  Highly doubt AAA.  No shortness of breath or symptoms to suggest PE.  Considered further evaluation with imaging, however patient would prefer to follow-up outpatient and go home with prescribed medications.  Will send for Robaxin and lidocaine patches for symptomatic relief and recommend Tylenol/ibuprofen as well.. Evaluation and diagnostic testing in the emergency department does not suggest an emergent condition requiring admission or immediate intervention beyond what has been performed at this time.  Plan for discharge with close PCP follow-up.  Patient is understanding and amenable with plan, educated on red flag symptoms that would prompt immediate return.  Patient discharged in stable  condition.  Final Clinical Impression(s) / ED Diagnoses Final diagnoses:  Acute left-sided thoracic back pain  Precordial chest pain    Rx / DC Orders ED Discharge Orders          Ordered    lidocaine (LIDODERM) 5 %  Every 24 hours,  Status:  Discontinued        12/23/23 1515    methocarbamol (ROBAXIN) 500 MG tablet  2 times daily,   Status:  Discontinued        12/23/23 1515    lidocaine (LIDODERM) 5 %  Every 24 hours        12/23/23 1521    methocarbamol (ROBAXIN) 500 MG tablet  2 times daily        12/23/23 1521          An After Visit Summary was printed and given to the patient.     Vear Clock 12/23/23 1522    Virgina Norfolk, DO 12/24/23 1410

## 2023-12-23 NOTE — ED Triage Notes (Signed)
Pt reports with right upper back pain that wraps around to the front. Pt reports having chronic back pain but it started hurting again this morning.

## 2023-12-23 NOTE — Discharge Instructions (Signed)
As we discussed, your workup in the ER today was reassuring for acute findings.  Laboratory evaluation, chest x-ray, and EKG did not reveal any emergent cause of your symptoms.  I suspect that your symptoms are muscular in nature and recommend that you rest and ice areas that are hurting you and take Tylenol/ibuprofen as needed for pain.  Have also given you a prescription for Robaxin which is a muscle relaxer you can take as prescribed as needed.  Do not drive or operate heavy machinery while taking this medication as it can be sedating.  You can also use lidocaine patches over the areas that are uncomfortable.  Follow-up closely with your primary care doctor.  Return for development of any new or worsening symptoms.

## 2023-12-28 ENCOUNTER — Encounter (HOSPITAL_COMMUNITY): Payer: Self-pay | Admitting: Emergency Medicine

## 2023-12-28 ENCOUNTER — Other Ambulatory Visit: Payer: Self-pay

## 2023-12-28 ENCOUNTER — Emergency Department (HOSPITAL_COMMUNITY)
Admission: EM | Admit: 2023-12-28 | Discharge: 2023-12-29 | Disposition: A | Discharge status: Home / Self Care | Payer: Medicaid Other | Attending: Emergency Medicine | Admitting: Emergency Medicine

## 2023-12-28 DIAGNOSIS — R202 Paresthesia of skin: Secondary | ICD-10-CM | POA: Insufficient documentation

## 2023-12-28 DIAGNOSIS — M5412 Radiculopathy, cervical region: Secondary | ICD-10-CM | POA: Insufficient documentation

## 2023-12-28 DIAGNOSIS — M25512 Pain in left shoulder: Secondary | ICD-10-CM | POA: Diagnosis present

## 2023-12-28 LAB — CBC WITH DIFFERENTIAL/PLATELET
Abs Immature Granulocytes: 0.02 10*3/uL (ref 0.00–0.07)
Basophils Absolute: 0 10*3/uL (ref 0.0–0.1)
Basophils Relative: 0 %
Eosinophils Absolute: 0.2 10*3/uL (ref 0.0–0.5)
Eosinophils Relative: 3 %
HCT: 41.6 % (ref 36.0–46.0)
Hemoglobin: 13.4 g/dL (ref 12.0–15.0)
Immature Granulocytes: 0 %
Lymphocytes Relative: 34 %
Lymphs Abs: 2.4 10*3/uL (ref 0.7–4.0)
MCH: 31.5 pg (ref 26.0–34.0)
MCHC: 32.2 g/dL (ref 30.0–36.0)
MCV: 97.9 fL (ref 80.0–100.0)
Monocytes Absolute: 0.8 10*3/uL (ref 0.1–1.0)
Monocytes Relative: 12 %
Neutro Abs: 3.5 10*3/uL (ref 1.7–7.7)
Neutrophils Relative %: 51 %
Platelets: 289 10*3/uL (ref 150–400)
RBC: 4.25 MIL/uL (ref 3.87–5.11)
RDW: 14 % (ref 11.5–15.5)
WBC: 7 10*3/uL (ref 4.0–10.5)
nRBC: 0 % (ref 0.0–0.2)

## 2023-12-28 LAB — COMPREHENSIVE METABOLIC PANEL
ALT: 19 U/L (ref 0–44)
AST: 26 U/L (ref 15–41)
Albumin: 4.2 g/dL (ref 3.5–5.0)
Alkaline Phosphatase: 72 U/L (ref 38–126)
Anion gap: 8 (ref 5–15)
BUN: 20 mg/dL (ref 6–20)
CO2: 24 mmol/L (ref 22–32)
Calcium: 9.4 mg/dL (ref 8.9–10.3)
Chloride: 109 mmol/L (ref 98–111)
Creatinine, Ser: 0.61 mg/dL (ref 0.44–1.00)
GFR, Estimated: >60 mL/min (ref >60)
Glucose, Bld: 106 mg/dL — ABNORMAL HIGH (ref 70–99)
Potassium: 4 mmol/L (ref 3.5–5.1)
Sodium: 141 mmol/L (ref 135–145)
Total Bilirubin: 0.6 mg/dL (ref 0.0–1.2)
Total Protein: 7.5 g/dL (ref 6.5–8.1)

## 2023-12-28 LAB — MAGNESIUM: Magnesium: 2.4 mg/dL (ref 1.7–2.4)

## 2023-12-28 NOTE — ED Provider Triage Note (Signed)
Emergency Medicine Provider Triage Evaluation Note  [name] , a 58 y.o. female  was evaluated in triage.  Pt complains of pain in her left shoulder that goes into her left neck that started yesterday.  Reports it is very painful to move the left arm due to the shoulder pain.  Denies any injuries to the area.  Denies any dizziness or vision changes.  Also reports numbness "all over" her body.  Reports the numbness in her face, arms and legs bilaterally.  Denies any facial droop, slurred speech.  Denies any chest pain or shortness of breath.  Took Robaxin earlier to try and help with her symptoms  Review of Systems  Positive: As above Negative: As above  Physical Exam  BP (!) 170/104   Pulse 80   Temp 98.8 F (37.1 C)   Resp 16   LMP 01/18/2012   SpO2 99%  Gen:   Awake, no distress   Resp:  Normal effort  MSK:   Does not want to move the left arm Other:  No facial droop, slurred speech.  Able to walk without difficulty  Medical Decision Making  Medically screening exam initiated at 11:08 PM.  Appropriate orders placed.  Deirdre Gryder was informed that the remainder of the evaluation will be completed by another provider, this initial triage assessment does not replace that evaluation, and the importance of remaining in the ED until their evaluation is complete.     Arabella Merles, PA-C 12/28/23 2310

## 2023-12-28 NOTE — ED Triage Notes (Signed)
Patient c/o left shoulder numbness and pain radiating to her left hand started yesterday. Patient report worsening numbness tonight. Patient c/o headache. Patient denies Chest pain. Patient denies SOB. Patient denies N/V.

## 2023-12-29 ENCOUNTER — Emergency Department (HOSPITAL_COMMUNITY): Payer: Medicaid Other

## 2023-12-29 MED ORDER — PREDNISONE 20 MG PO TABS
40.0000 mg | ORAL_TABLET | Freq: Once | ORAL | Status: AC
  Administered 2023-12-29: 40 mg via ORAL
  Filled 2023-12-29: qty 2

## 2023-12-29 MED ORDER — PREDNISONE 20 MG PO TABS
40.0000 mg | ORAL_TABLET | Freq: Every day | ORAL | 0 refills | Status: AC
Start: 2023-12-29 — End: 2024-01-03

## 2023-12-29 MED ORDER — OXYCODONE-ACETAMINOPHEN 5-325 MG PO TABS
1.0000 | ORAL_TABLET | Freq: Once | ORAL | Status: AC
  Administered 2023-12-29: 1 via ORAL
  Filled 2023-12-29: qty 1

## 2023-12-29 MED ORDER — OXYCODONE HCL 5 MG PO TABS: 5.0000 mg | ORAL_TABLET | ORAL | 0 refills | Status: AC | PRN

## 2023-12-29 MED ORDER — LIDOCAINE 5 % EX PTCH
1.0000 | MEDICATED_PATCH | CUTANEOUS | Status: DC
  Administered 2023-12-29: 1 via TRANSDERMAL
  Filled 2023-12-29: qty 1

## 2023-12-29 NOTE — Discharge Instructions (Addendum)
You were seen in the ER today for evaluation of your pain. Your workup was unremarkable. This appears to be muscular in nature.  You can take your already prescribed muscle relaxers.  I recommend taking Tylenol 1000 mg every 6 hours as needed for pain.  I have prescribed you a few narcotic pain pills for breakthrough pain.  Please take as prescribed.  Please do not take the narcotics while driving or operate heavy machinery as it will make you sleepy.  You can also apply over-the-counter topical lidocaine patches to the area as well.  If you start to have any trouble walking, trouble talking, chest pain, shortness of breath, weakness in the extremities not due to pain, color changes, temperature changes, return to the ER immediately.  Please make sure that you follow up with your already established orthopedic appointment.  If you have any other concerns, new or worsening symptoms, please return to your nearest emerged part for reevaluation.  Contact a doctor if: Your condition does not get better with treatment. Get help right away if: Your pain gets worse and medicine does not help. You lose feeling or feel weak in your hand, arm, face, or leg. You have a high fever. Your neck is stiff. You cannot control when you poop or pee (have incontinence). You have trouble with walking, balance, or talking.

## 2023-12-30 NOTE — ED Provider Notes (Signed)
[name] EMERGENCY DEPARTMENT AT [facility] Provider Note   CSN: [id number] Arrival date & time: 12/28/23  2246     History Chief Complaint  Patient presents with   Numbness    [name] is a 58 y.o. female self reportedly otherwise healthy presents emerged from today for evaluation of pain in her left shoulder since yesterday.  She reports that she is pain with movement of the shoulder.  Denies any injury or trauma.  Denies any heavy lifting.  She denies any headache or vision changes.  Denies any lightheadedness.  Denies any chest pain or shortness of breath.  She reports that she feels tingling from her shoulder down to her fingertips and also can feel it going down from her shoulder to the bottom of her foot.  She denies any trouble walking or talking.  From reading previous provider note, mentions numbness to the face arms and legs bilaterally.  She denied any numbness to the arms or other areas to me.  She was seen here a few days prior for some left shoulder and back pain and was given Robaxin.  She reports that she follow-up with her primary care doctor and was told it was urinary tract infection.  HPI     Home Medications Prior to Admission medications   Medication Sig Start Date End Date Taking? Authorizing Provider  oxyCODONE (ROXICODONE) 5 MG immediate release tablet Take 1 tablet (5 mg total) by mouth every 4 (four) hours as needed for severe pain (pain score 7-10). 12/29/23  Yes Achille Rich, PA-C  albuterol (VENTOLIN HFA) 108 (90 Base) MCG/ACT inhaler Inhale 2 puffs into the lungs every 6 (six) hours as needed for shortness of breath or wheezing. 02/08/22   [provider]  atorvastatin (LIPITOR) 40 MG tablet Take 40 mg by mouth daily.    [provider]  benzonatate (TESSALON) 100 MG capsule Take 1 capsule (100 mg total) by mouth every 8 (eight) hours. 10/21/23   Rexford Maus, DO  cyclobenzaprine (FLEXERIL) 10 MG tablet Take 1  tablet (10 mg total) by mouth 2 (two) times daily as needed for muscle spasms. 04/25/22   Ernie Avena, MD  doxycycline (VIBRAMYCIN) 100 MG capsule Take 1 capsule (100 mg total) by mouth 2 (two) times daily. One po bid x 7 days 11/02/23   Melene Plan, DO  EPINEPHrine 0.3 mg/0.3 mL IJ SOAJ injection Inject 0.3 mg into the muscle as needed for anaphylaxis. 02/09/22   [provider]  fexofenadine (ALLEGRA) 60 MG tablet Take 60 mg by mouth daily.    [provider]  fluticasone (FLONASE) 50 MCG/ACT nasal spray Place 1 spray into both nostrils daily.    [provider]  hydrochlorothiazide (HYDRODIURIL) 25 MG tablet Take 1 tablet (25 mg total) by mouth daily. Patient not taking: Reported on 07/03/2022 07/04/19   Molpus, John, MD  lidocaine (LIDODERM) 5 % Place 1 patch onto the skin daily. Remove & Discard patch within 12 hours or as directed by MD 12/23/23   Smoot, Shawn Route, PA-C  loratadine (CLARITIN) 10 MG tablet Take 10 mg by mouth daily.    [provider]  methocarbamol (ROBAXIN) 500 MG tablet Take 1 tablet (500 mg total) by mouth 2 (two) times daily. 12/23/23   Smoot, Sarah A, PA-C  montelukast (SINGULAIR) 10 MG tablet Take 10 mg by mouth at bedtime as needed (allergies).     [provider]  naproxen (NAPROSYN) 500 MG tablet Take 1 tablet (  500 mg total) by mouth 2 (two) times daily. Patient not taking: Reported on 07/03/2022 04/25/22   Ernie Avena, MD  predniSONE (DELTASONE) 20 MG tablet Take 2 tablets (40 mg total) by mouth daily for 5 days. 12/29/23 01/03/24  Achille Rich, PA-C  Vitamin D, Ergocalciferol, (DRISDOL) 1.25 MG (50000 UT) CAPS capsule Take 50,000 Units by mouth every 7 (seven) days.    [provider]      Allergies    Aspirin and Penicillins    Review of Systems   Review of Systems  Constitutional:  Negative for chills and fever.  HENT:  Negative for congestion and rhinorrhea.   Respiratory:  Negative for cough and shortness of  breath.   Cardiovascular:  Negative for chest pain.  Gastrointestinal:  Negative for abdominal pain, constipation, diarrhea, nausea and vomiting.  Genitourinary:  Negative for dysuria and hematuria.  Musculoskeletal:  Positive for arthralgias and myalgias.  Neurological:  Negative for headaches.  SEE HPI  Physical Exam Updated Vital Signs BP (!) 156/78 (BP Location: Right Arm)   Pulse 66   Temp 97.6 F (36.4 C) (Oral)   Resp 16   LMP 01/18/2012   SpO2 98%  Physical Exam Vitals and nursing note reviewed.  Constitutional:      General: She is not in acute distress.    Appearance: She is not ill-appearing or toxic-appearing.  Eyes:     General: No scleral icterus.    Extraocular Movements: Extraocular movements intact.     Pupils: Pupils are equal, round, and reactive to light.  Neck:     Comments: No midline tenderness.  Some very mild very lateral lower paraspinal cervical tenderness on the left.  Full range of motion of the neck. Cardiovascular:     Rate and Rhythm: Normal rate.     Pulses: Normal pulses.  Pulmonary:     Effort: Pulmonary effort is normal. No respiratory distress.  Musculoskeletal:       Arms:     Cervical back: Normal range of motion.     Right lower leg: No edema.     Left lower leg: No edema.     Comments: Strength is equal and symmetric in bilateral lower and upper extremities.  Grip strength is equal and strong.  She reports sensations intact in the bilateral upper and lower extremities.  Apartments are soft.  Finger thumb opposition strong and symmetric with all 4 fingers in both hands.  She does have significant, diffuse tenderness to the left upper anterior chest going into the deltoid as well as the subscapularis.  There is no overlying skin changes.  Skin:    General: Skin is warm and dry.  Neurological:     Mental Status: She is alert.     GCS: GCS eye subscore is 4. GCS verbal subscore is 5. GCS motor subscore is 6.     Cranial Nerves: No  cranial nerve deficit, dysarthria or facial asymmetry.     Sensory: No sensory deficit.     Motor: No weakness.     Gait: Gait is intact. Gait normal.     ED Results / Procedures / Treatments   Labs (all labs ordered are listed, but only abnormal results are displayed) Labs Reviewed  COMPREHENSIVE METABOLIC PANEL - Abnormal; Notable for the following components:      Result Value   Glucose, Bld 106 (*)    All other components within normal limits  CBC WITH DIFFERENTIAL/PLATELET  MAGNESIUM    EKG None  Radiology CT Head Wo Contrast Result Date: 12/29/2023 CLINICAL DATA:  New numbness in the left shoulder and left hand. EXAM: CT HEAD WITHOUT CONTRAST TECHNIQUE: Contiguous axial images were obtained from the base of the skull through the vertex without intravenous contrast. RADIATION DOSE REDUCTION: This exam was performed according to the departmental dose-optimization program which includes automated exposure control, adjustment of the mA and/or kV according to patient size and/or use of iterative reconstruction technique. COMPARISON:  Head CT 08/22/2022 FINDINGS: Brain: No evidence of acute infarction, hemorrhage, hydrocephalus, extra-axial collection or mass lesion/mass effect. Vascular: Atherosclerotic calcifications are present within the cavernous internal carotid arteries. Skull: Normal. Negative for fracture or focal lesion. Sinuses/Orbits: No acute finding. Other: None. IMPRESSION: No acute intracranial abnormality. Electronically Signed   By: Darliss Cheney M.D.   On: 12/29/2023 00:36    Procedures Procedures   Medications Ordered in ED Medications  predniSONE (DELTASONE) tablet 40 mg (40 mg Oral Given 12/29/23 0306)  oxyCODONE-acetaminophen (PERCOCET/ROXICET) 5-325 MG per tablet 1 tablet (1 tablet Oral Given 12/29/23 0307)    ED Course/ Medical Decision Making/ A&P   {    Medical Decision Making Risk Prescription drug management.   58 y.o. female presents to the ER  for evaluation of left shoulder pain with tingling sensation. Differential diagnosis includes but is not limited to radiculopathy, vitamin deficiency, muscle spasm. Vital signs mildly elevated blood pressure otherwise unremarkable. Physical exam as noted above.   Labs and imaging ordered in triage.  I independently reviewed and interpreted the patient's labs.  CBC without leukocytosis or anemia.  Magnesium within normal limits.  CMP shows glucose at 106 otherwise unremarkable for any electrolyte or LFT abnormality.  CT imaging shows No acute intracranial abnormality.   On previous chart evaluation, patient's been seen before for left-sided cervical radiculopathy.  She is also been seen for left-sided carpal tunnel at orthopedics as well.  This does not seem to be a new problem.  She was recently given Robaxin and ibuprofen for similar pain seen a few days prior.  I will send her home with some prednisone as well as some narcotic pain medication for breakthrough pain.  Lidocaine patch provided as well.  This seems to be more radicular in nature.  She has equal strength and sensation reportedly on my examination.  Strength is with and without resistance.  She has finger thumb opposition intact and symmetric in all 4 fingers.  She has a steady gait and equal strength of the lower extremities.  Again reports her sensations intact in the lower extremities as well.  She does not have any midline or paraspinal thoracic or lumbar tenderness.  No tenderness in the buttocks or over the SI joints.  I think she is likely experiencing some muscular spasm.  Does not follow a certain dermatome.  I have a lower suspicion for any stroke given that there is pain and is reproducible upon movement and palpation.  I do not appreciate any weakness.  She has however radial pulses that are symmetric and equal.  Palpable DP and PT pulses are symmetric and equal as well.  Think this is likely her known radiculopathy.  I doubt any  stroke, TIA, or cardiac in etiology given that the pain is reproducible upon palpation and movement.  There is no overlying skin changes to suggest any shingles or deep space infection.  She reports that she has an orthopedic appointment in the next few days.  Encouraged her to follow-up with that appointment.  Strict return precautions given.  We discussed the results of the labs/imaging. The plan is take medication as prescribed, follow-up with orthopedic appointment.. We discussed strict return precautions and red flag symptoms. The patient verbalized their understanding and agrees to the plan. The patient is stable and being discharged home in good condition.  Portions of this report may have been transcribed using voice recognition software. Every effort was made to ensure accuracy; however, inadvertent computerized transcription errors may be present.   I discussed this case with my attending physician who cosigned this note including patient's presenting symptoms, physical exam, and planned diagnostics and interventions. Attending physician stated agreement with plan or made changes to plan which were implemented.   Final Clinical Impression(s) / ED Diagnoses Final diagnoses:  Cervical radiculopathy    Rx / DC Orders ED Discharge Orders          Ordered    predniSONE (DELTASONE) 20 MG tablet  Daily        12/29/23 0253    oxyCODONE (ROXICODONE) 5 MG immediate release tablet  Every 4 hours PRN        12/29/23 0257              Achille Rich, PA-C 12/30/23 2044    Gilda Crease, MD 12/31/23 [phone]

## 2024-05-16 ENCOUNTER — Encounter (HOSPITAL_COMMUNITY): Payer: Self-pay

## 2024-05-16 ENCOUNTER — Emergency Department (HOSPITAL_COMMUNITY)
Admission: EM | Admit: 2024-05-16 | Discharge: 2024-05-17 | Disposition: A | Discharge status: Home / Self Care | Attending: Emergency Medicine | Admitting: Emergency Medicine

## 2024-05-16 ENCOUNTER — Other Ambulatory Visit: Payer: Self-pay

## 2024-05-16 DIAGNOSIS — R1030 Lower abdominal pain, unspecified: Secondary | ICD-10-CM | POA: Diagnosis present

## 2024-05-16 DIAGNOSIS — R1032 Left lower quadrant pain: Secondary | ICD-10-CM

## 2024-05-16 LAB — COMPREHENSIVE METABOLIC PANEL WITH GFR
ALT: 30 U/L (ref 0–44)
AST: 31 U/L (ref 15–41)
Albumin: 4.2 g/dL (ref 3.5–5.0)
Alkaline Phosphatase: 92 U/L (ref 38–126)
Anion gap: 9 (ref 5–15)
BUN: 23 mg/dL — ABNORMAL HIGH (ref 6–20)
CO2: 22 mmol/L (ref 22–32)
Calcium: 9.4 mg/dL (ref 8.9–10.3)
Chloride: 109 mmol/L (ref 98–111)
Creatinine, Ser: 0.61 mg/dL (ref 0.44–1.00)
GFR, Estimated: >60 mL/min (ref >60)
Glucose, Bld: 82 mg/dL (ref 70–99)
Potassium: 3.4 mmol/L — ABNORMAL LOW (ref 3.5–5.1)
Sodium: 140 mmol/L (ref 135–145)
Total Bilirubin: 0.7 mg/dL (ref 0.0–1.2)
Total Protein: 7.6 g/dL (ref 6.5–8.1)

## 2024-05-16 LAB — CBC
HCT: 40 % (ref 36.0–46.0)
Hemoglobin: 12.9 g/dL (ref 12.0–15.0)
MCH: 31.7 pg (ref 26.0–34.0)
MCHC: 32.3 g/dL (ref 30.0–36.0)
MCV: 98.3 fL (ref 80.0–100.0)
Platelets: 274 10*3/uL (ref 150–400)
RBC: 4.07 MIL/uL (ref 3.87–5.11)
RDW: 14.2 % (ref 11.5–15.5)
WBC: 6.2 10*3/uL (ref 4.0–10.5)
nRBC: 0 % (ref 0.0–0.2)

## 2024-05-16 LAB — URINALYSIS, ROUTINE W REFLEX MICROSCOPIC
Bilirubin Urine: NEGATIVE
Glucose, UA: NEGATIVE mg/dL
Hgb urine dipstick: NEGATIVE
Ketones, ur: NEGATIVE mg/dL
Leukocytes,Ua: NEGATIVE
Nitrite: NEGATIVE
Protein, ur: NEGATIVE mg/dL
Specific Gravity, Urine: 1.025 (ref 1.005–1.030)
pH: 5 (ref 5.0–8.0)

## 2024-05-16 LAB — LIPASE, BLOOD: Lipase: 30 U/L (ref 11–51)

## 2024-05-16 NOTE — ED Triage Notes (Signed)
 Pt states she is having groin pain/ pressure that makes it where she can barely lift her left leg. PT states she had a whelp on her left groin for 2-3 months. Reports the swelling on that has gone down but the pain is so much worse. Denies n/v/d.

## 2024-05-16 NOTE — ED Notes (Signed)
 [place] PIV placed.

## 2024-05-16 NOTE — ED Notes (Signed)
 Phlebotomy unsuccessful

## 2024-05-17 MED ORDER — KETOROLAC TROMETHAMINE 60 MG/2ML IM SOLN
30.0000 mg | Freq: Once | INTRAMUSCULAR | Status: AC
  Administered 2024-05-17: 30 mg via INTRAMUSCULAR
  Filled 2024-05-17: qty 2

## 2024-05-17 NOTE — ED Provider Notes (Signed)
 [place] EMERGENCY DEPARTMENT AT [facility] Provider Note  CSN: [id number] Arrival date & time: 05/16/24 2039  Chief Complaint(s) Groin Pain  HPI [name] is a 58 y.o. female here for recurrent groin pain ongoing for the past 2 to 3 months.  Patient had an episode today that was more severe than normal.  She reports previously having a whelp in the area.  No redness.  No trauma.  No urinary symptoms.  No nausea vomiting.  No abdominal pain.  Pain is worse with range of motion of the left leg and palpation.  The history is provided by the patient.    Past Medical History Past Medical History:  Diagnosis Date   Chest pain 2008   neg work up- dx'd with stress   Hyperlipidemia    No pertinent past medical history    Patient Active Problem List   Diagnosis Date Noted   Premature atrial complexes 08/19/2022   Anxiety 03/20/2019   Ingrowing nail 03/27/2018   Gastroesophageal reflux disease without esophagitis 03/15/2018   Class 1 obesity due to excess calories without serious comorbidity with body mass index (BMI) of 30.0 to 30.9 in adult 03/15/2018   Allergic rhinitis 01/12/2016   Migraine 01/12/2016   HYPERLIPIDEMIA 01/05/2011   Otalgia 03/09/2009   ELBOW PAIN 03/09/2009   Home Medication(s) Prior to Admission medications   Medication Sig Start Date End Date Taking? Authorizing Provider  albuterol (VENTOLIN HFA) 108 (90 Base) MCG/ACT inhaler Inhale 2 puffs into the lungs every 6 (six) hours as needed for shortness of breath or wheezing. 02/08/22  Yes [provider]  atorvastatin (LIPITOR) 40 MG tablet Take 40 mg by mouth every evening.   Yes [provider]  cholecalciferol (VITAMIN D3) 25 MCG (1000 UNIT) tablet Take 1,000 Units by mouth daily.   Yes [provider]  diazepam (VALIUM) 5 MG tablet Take 5 mg by mouth every 8 (eight) hours as needed. 02/29/24  Yes [provider]  EPINEPHrine 0.3 mg/0.3 mL IJ SOAJ injection  Inject 0.3 mg into the muscle as needed for anaphylaxis. 02/09/22  Yes [provider]  fluticasone (FLONASE) 50 MCG/ACT nasal spray Place 1 spray into both nostrils daily.   Yes [provider]  loratadine (CLARITIN) 10 MG tablet Take 10 mg by mouth daily.   Yes [provider]  montelukast (SINGULAIR) 10 MG tablet Take 10 mg by mouth daily.   Yes [provider]  nicotine (NICODERM CQ - DOSED IN MG/24 HOURS) 21 mg/24hr patch Place 1 patch onto the skin. 05/08/24  Yes [provider]  benzonatate  (TESSALON ) 100 MG capsule Take 1 capsule (100 mg total) by mouth every 8 (eight) hours. Patient not taking: Reported on 05/17/2024 10/21/23   Kingsley, Victoria K, DO  cyclobenzaprine  (FLEXERIL ) 10 MG tablet Take 1 tablet (10 mg total) by mouth 2 (two) times daily as needed for muscle spasms. Patient not taking: Reported on 05/17/2024 04/25/22   Rosealee Concha, MD  doxycycline  (VIBRAMYCIN ) 100 MG capsule Take 1 capsule (100 mg total) by mouth 2 (two) times daily. One po bid x 7 days Patient not taking: Reported on 05/17/2024 11/02/23   Albertus Hughs, DO  hydrochlorothiazide  (HYDRODIURIL ) 25 MG tablet Take 1 tablet (25 mg total) by mouth daily. Patient not taking: Reported on 07/03/2022 07/04/19   Molpus, John, MD  lidocaine  (LIDODERM ) 5 % Place 1 patch onto the skin daily. Remove & Discard patch within 12 hours or as directed by MD Patient not  taking: Reported on 05/17/2024 12/23/23   Smoot, Genevive Ket, PA-C  methocarbamol  (ROBAXIN ) 500 MG tablet Take 1 tablet (500 mg total) by mouth 2 (two) times daily. Patient not taking: Reported on 05/17/2024 12/23/23   Smoot, Genevive Ket, PA-C  naproxen  (NAPROSYN ) 500 MG tablet Take 1 tablet (500 mg total) by mouth 2 (two) times daily. Patient not taking: Reported on 07/03/2022 04/25/22   Rosealee Concha, MD  oxyCODONE  (ROXICODONE ) 5 MG immediate release tablet Take 1 tablet (5 mg total) by mouth every 4 (four) hours as needed for severe pain (pain  score 7-10). Patient not taking: Reported on 05/17/2024 12/29/23   Spence Dux, PA-C                                                                                                                                    Allergies Aspirin and Penicillins  Review of Systems Review of Systems As noted in HPI  Physical Exam Vital Signs  I have reviewed the triage vital signs BP (!) 162/84   Pulse 70   Temp 98 F (36.7 C)   Resp 18   Ht 5' 6 (1.676 m)   Wt 73.5 kg   LMP 01/18/2012   SpO2 97%   BMI 26.15 kg/m   Physical Exam Vitals reviewed. Exam conducted with a chaperone present.  Constitutional:      General: She is not in acute distress.    Appearance: She is well-developed. She is not diaphoretic.  HENT:     Head: Normocephalic and atraumatic.     Right Ear: External ear normal.     Left Ear: External ear normal.     Nose: Nose normal.   Eyes:     General: No scleral icterus.    Conjunctiva/sclera: Conjunctivae normal.   Neck:     Trachea: Phonation normal.   Cardiovascular:     Rate and Rhythm: Normal rate and regular rhythm.  Pulmonary:     Effort: Pulmonary effort is normal. No respiratory distress.     Breath sounds: No stridor.  Abdominal:     General: There is no distension.  Genitourinary:    Comments: Patient has tenderness to the mons pubis and inguinal region.  No rash, erythema, lymphadenopathy, lesions, abscess noted on exam.  No difference in soft tissue consistency from right side  Musculoskeletal:        General: Normal range of motion.     Cervical back: Normal range of motion.   Neurological:     Mental Status: She is alert and oriented to person, place, and time.   Psychiatric:        Behavior: Behavior normal.     ED Results and Treatments Labs (all labs ordered are listed, but only abnormal results are displayed) Labs Reviewed  COMPREHENSIVE METABOLIC PANEL WITH GFR - Abnormal; Notable for the following components:      Result  Value   Potassium  3.4 (*)    BUN 23 (*)    All other components within normal limits  LIPASE, BLOOD  CBC  URINALYSIS, ROUTINE W REFLEX MICROSCOPIC                                                                                                                         EKG  EKG Interpretation Date/Time:    Ventricular Rate:    PR Interval:    QRS Duration:    QT Interval:    QTC Calculation:   R Axis:      Text Interpretation:         Radiology No results found.  Medications Ordered in ED Medications  ketorolac  (TORADOL ) injection 30 mg (30 mg Intramuscular Given 05/17/24 0243)   Procedures Procedures  (including critical care time) Medical Decision Making / ED Course   Medical Decision Making Amount and/or Complexity of Data Reviewed Labs: ordered. Decision-making details documented in ED Course.  Risk Prescription drug management.    Left groin pain. Exam is not concerning for infectious etiology such as abscess.  Labs are reassuring without leukocytosis or anemia.  No significant electrolyte derangements or renal insufficiency.  UA without evidence of infection.  Pain is isolated to the superficial soft tissues.  Doubt serious internal process requiring imaging or further workup at this time.    Final Clinical Impression(s) / ED Diagnoses Final diagnoses:  Left inguinal pain   The patient appears reasonably screened and/or stabilized for discharge and I doubt any other medical condition or other [facility] requiring further screening, evaluation, or treatment in the ED at this time. I have discussed the findings, Dx and Tx plan with the patient/family who expressed understanding and agree(s) with the plan. Discharge instructions discussed at length. The patient/family was given strict return precautions who verbalized understanding of the instructions. No further questions at time of discharge.  Disposition: Discharge  Condition: Good  ED Discharge Orders      None         Follow Up: Sheilah Denver., MD [address] [place] [address] [phone]  Call  to schedule an appointment for close follow up    This chart was dictated using voice recognition software.  Despite best efforts to proofread,  errors can occur which can change the documentation meaning.    Lindle Rhea, MD 05/17/24 [phone]

## 2024-05-17 NOTE — Discharge Instructions (Signed)
 Nerve pain in the mons pubis can be a symptom of several conditions, most notably pudendal neuralgia or genitofemoral neuralgia. Pudendal neuralgia is characterized by pain in the areas supplied by the pudendal nerve, including the genitals, perineum, and anus, and is often aggravated by sitting. Genitofemoral neuralgia, on the other hand, involves pain in the groin area, potentially radiating to the mons pubis and inner thigh, and can result from nerve damage during procedures like inguinal hernia surgery.  Pudendal Neuralgia: Symptoms: Burning, shooting, or stabbing pain in the perineum, vulva, or rectum, often worsened by sitting.  Causes: Entrapment or injury to the pudendal nerve, potentially due to childbirth, surgery, prolonged sitting, or repetitive motions like cycling.  Treatment: Physical therapy, nerve blocks, medications (including pain relievers and nerve-specific medications), and in some cases, surgery.  Genitofemoral Neuralgia: Symptoms: Pain or numbness in the groin, potentially radiating to the mons pubis and inner thigh, often following surgery in the inguinal area. Causes: Damage or compression of the genitofemoral nerve, often during inguinal hernia repair. Treatment: Nerve blocks, medications, and in some cases, nerve ablation or surgical exploration.  Other Possible Causes: Ilioinguinal Neuralgia: . Sharp, burning pain in the lower abdomen and groin, potentially radiating to the genitals and inner thigh.  Trauma: . Direct injury to the mons pubis area from falls, accidents, or sports injuries can cause nerve irritation and pain.  Infections: . Certain infections, like herpes zoster, can cause nerve pain in the affected area.  Medical Conditions: . Conditions like arthritis or osteoporosis can also contribute to pain in the mons pubis area.

## 2024-05-17 NOTE — ED Notes (Signed)
 Assisted EDP in exam of pt. Pt tolerated well. Resting in position of comfort with call bell in reach.
